# Patient Record
Sex: Male | Born: 1990 | Race: White | Hispanic: No | Marital: Single | Smoking: Current every day smoker
Health system: Southern US, Community
[De-identification: ages and names within clinical notes are randomized; demographics above are authoritative.]

## PROBLEM LIST (undated history)

## (undated) HISTORY — PX: OTHER SURGICAL HISTORY: SHX169

---
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
## 2015-02-04 ENCOUNTER — Encounter: Payer: Self-pay | Admitting: Emergency Medicine

## 2015-02-04 ENCOUNTER — Emergency Department: Payer: Self-pay

## 2015-02-04 ENCOUNTER — Emergency Department
Admission: EM | Admit: 2015-02-04 | Discharge: 2015-02-04 | Disposition: A | Discharge status: Home / Self Care | Payer: Self-pay | Attending: Emergency Medicine | Admitting: Emergency Medicine

## 2015-02-04 DIAGNOSIS — S62172A Displaced fracture of trapezium [larger multangular], left wrist, initial encounter for closed fracture: Secondary | ICD-10-CM | POA: Insufficient documentation

## 2015-02-04 DIAGNOSIS — Y9389 Activity, other specified: Secondary | ICD-10-CM | POA: Insufficient documentation

## 2015-02-04 DIAGNOSIS — Y92410 Unspecified street and highway as the place of occurrence of the external cause: Secondary | ICD-10-CM | POA: Insufficient documentation

## 2015-02-04 DIAGNOSIS — F17210 Nicotine dependence, cigarettes, uncomplicated: Secondary | ICD-10-CM | POA: Insufficient documentation

## 2015-02-04 DIAGNOSIS — Y998 Other external cause status: Secondary | ICD-10-CM | POA: Insufficient documentation

## 2015-02-04 MED ORDER — HYDROCODONE-ACETAMINOPHEN 5-325 MG PO TABS: 1.0000 | ORAL_TABLET | Freq: Four times a day (QID) | ORAL | Status: DC | PRN

## 2015-02-04 MED ORDER — TRAMADOL HCL 50 MG PO TABS: 50.0000 mg | ORAL_TABLET | Freq: Four times a day (QID) | ORAL | Status: DC | PRN
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
## 2015-02-04 NOTE — Discharge Instructions (Signed)
Avulsion Fracture of the Hand  An avulsion fracture of the hand means that a piece of bone in your hand or wrist has been torn away. Bones are connected to other bones by strong bands of connective tissue (ligaments). Muscles are also connected to bones with connective tissue (tendons). Avulsion fractures occur when severe stress on a bone from a ligament or tendon causes a small piece of bone to be pulled away.  CAUSES  Avulsion fractures of the hand may be caused by:  Falling with your hand outstretched.  Sports injuries.  Injuries where a thumb or finger is jammed against a hard surface. RISK FACTORS  You may have a higher risk of avulsion fracture of the hand if you:  Participate in activities during which falling is more likely, such as:  Gymnastics.  Skiing.  Ice skating. Participate in activities during which jamming your thumb or finger is more likely, such as basketball.  Have a job or participate in activities in which sudden force may occur on one or more fingers, such as:  Football.  Construction work.  Carpentry.  Factory work. Have had diabetes for many years.  Have osteoporosis. SIGNS AND SYMPTOMS  The most common symptom of an avulsion fracture of the hand is intense pain at the time of injury. You may also feel a pop or tearing. The pain continues after the injury. Other signs and symptoms may include:  Swelling.  Bruising.  Pain with movement.  Pain when pressure is applied to the injured area.  Warmth over the injured area. DIAGNOSIS  An avulsion fracture of the the hand may be diagnosed by:  History. Your health care provider will ask you what occurred during the time of your injury and whether you had any pain in the area before your injury.  Physical exam. Your health care provider may try to move your hand, fingers, and wrist to check for pain and your ability to move these parts of your hand.  X-ray. This will show if any bones are fractured or out of place.    MRI. This will show your tendons and ligaments. Some avulsion fractures are associated with an injury to a tendon or ligament. TREATMENT  Treatment for an avulsion fracture of the hand depends on the size of the bone that is out of place and how much it has been pulled out of place. Small avulsion fractures may be treated with rest and support. Large fragments of bone usually need to be reattached surgically. Treatment of these fractures may also require physical therapy to regain full function of your hand or wrist. Treatment may include:  Rest, ice, compression, and elevation (RICE treatment) as directed by your health care provider.  Medicines that reduce pain and swelling (NSAIDs).  Wearing a splint, elastic wrap, or cast as directed by your health care provider.  Surgery to reattach the bone and tendon or ligament.  Physical therapy. This may last for several months. HOME CARE INSTRUCTIONS  Take medicines only as directed by your health care provider.  Rest your hand until your health care provider says you can resume activity.  Keep your hand raised above the level of your heart when resting.  Apply ice to the injured area:  Place ice in a plastic bag.  Place a towel between your skin and the bag.  Leave the ice for 20 minutes, 2-3 times a day. Do not allow your cast or splint to get wet as directed by your health care provider.  Keep all follow-up visits as directed by your health care provider. This is important. SEEK MEDICAL CARE IF:  Your pain gets worse.  You have chills or fever.  Your cast or splint is damaged.  Your cast has a bad odor or stains from fluids from your wound. SEEK IMMEDIATE MEDICAL CARE IF:  Your hand is cold, blue, or pale.  You have pain, swelling, redness, or numbness below your cast or splint. MAKE SURE YOU:  Understand these instructions.  Will watch your condition.  Will get help right away if you are not doing well or get worse. This information is  not intended to replace advice given to you by your health care provider. Make sure you discuss any questions you have with your health care provider.  Document Released: 02/14/2004 Document Revised: 01/27/2014 Document Reviewed: 04/01/2013  Elsevier Interactive Patient Education [employer]
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
## 2015-03-16 ENCOUNTER — Encounter: Payer: Self-pay | Admitting: Emergency Medicine

## 2015-03-16 ENCOUNTER — Emergency Department: Payer: Self-pay

## 2015-03-16 ENCOUNTER — Emergency Department
Admission: EM | Admit: 2015-03-16 | Discharge: 2015-03-16 | Disposition: A | Discharge status: Home / Self Care | Payer: Self-pay | Attending: Emergency Medicine | Admitting: Emergency Medicine

## 2015-03-16 DIAGNOSIS — R112 Nausea with vomiting, unspecified: Secondary | ICD-10-CM | POA: Insufficient documentation

## 2015-03-16 DIAGNOSIS — F17210 Nicotine dependence, cigarettes, uncomplicated: Secondary | ICD-10-CM | POA: Insufficient documentation

## 2015-03-16 DIAGNOSIS — R197 Diarrhea, unspecified: Secondary | ICD-10-CM | POA: Insufficient documentation

## 2015-03-16 DIAGNOSIS — M549 Dorsalgia, unspecified: Secondary | ICD-10-CM

## 2015-03-16 DIAGNOSIS — M545 Low back pain: Secondary | ICD-10-CM | POA: Insufficient documentation

## 2015-03-16 LAB — COMPREHENSIVE METABOLIC PANEL
ALT: 17 U/L (ref 17–63)
AST: 24 U/L (ref 15–41)
Albumin: 4.8 g/dL (ref 3.5–5.0)
Alkaline Phosphatase: 80 U/L (ref 38–126)
Anion gap: 10 (ref 5–15)
BUN: 17 mg/dL (ref 6–20)
CHLORIDE: 99 mmol/L — AB (ref 101–111)
CO2: 25 mmol/L (ref 22–32)
CREATININE: 0.99 mg/dL (ref 0.61–1.24)
Calcium: 9.4 mg/dL (ref 8.9–10.3)
Glucose, Bld: 113 mg/dL — ABNORMAL HIGH (ref 65–99)
POTASSIUM: 3.7 mmol/L (ref 3.5–5.1)
Sodium: 134 mmol/L — ABNORMAL LOW (ref 135–145)
TOTAL PROTEIN: 7.9 g/dL (ref 6.5–8.1)
Total Bilirubin: 1.7 mg/dL — ABNORMAL HIGH (ref 0.3–1.2)

## 2015-03-16 LAB — URINALYSIS COMPLETE WITH MICROSCOPIC (ARMC ONLY)
BILIRUBIN URINE: NEGATIVE
Bacteria, UA: NONE SEEN
Glucose, UA: NEGATIVE mg/dL
HGB URINE DIPSTICK: NEGATIVE
LEUKOCYTES UA: NEGATIVE
NITRITE: NEGATIVE
PH: 8 (ref 5.0–8.0)
Protein, ur: 30 mg/dL — AB
RBC / HPF: NONE SEEN RBC/hpf (ref 0–5)
SPECIFIC GRAVITY, URINE: 1.028 (ref 1.005–1.030)
SQUAMOUS EPITHELIAL / LPF: NONE SEEN

## 2015-03-16 LAB — CBC
HCT: 47.8 % (ref 40.0–52.0)
Hemoglobin: 16.2 g/dL (ref 13.0–18.0)
MCH: 30.4 pg (ref 26.0–34.0)
MCHC: 33.8 g/dL (ref 32.0–36.0)
MCV: 89.8 fL (ref 80.0–100.0)
PLATELETS: 237 10*3/uL (ref 150–440)
RBC: 5.33 MIL/uL (ref 4.40–5.90)
RDW: 13.2 % (ref 11.5–14.5)
WBC: 15.1 10*3/uL — AB (ref 3.8–10.6)

## 2015-03-16 LAB — LIPASE, BLOOD: LIPASE: 22 U/L (ref 11–51)

## 2015-03-16 MED ORDER — KETOROLAC TROMETHAMINE 60 MG/2ML IM SOLN
60.0000 mg | Freq: Once | INTRAMUSCULAR | Status: AC
  Administered 2015-03-16: 60 mg via INTRAMUSCULAR

## 2015-03-16 MED ORDER — SUCRALFATE 1 G PO TABS: 1.0000 g | ORAL_TABLET | Freq: Four times a day (QID) | ORAL | Status: DC

## 2015-03-16 MED ORDER — KETOROLAC TROMETHAMINE 60 MG/2ML IM SOLN
INTRAMUSCULAR | Status: AC
  Administered 2015-03-16: 60 mg via INTRAMUSCULAR
  Filled 2015-03-16: qty 2

## 2015-03-16 MED ORDER — CYCLOBENZAPRINE HCL 5 MG PO TABS: 5.0000 mg | ORAL_TABLET | Freq: Three times a day (TID) | ORAL | Status: DC | PRN

## 2015-03-16 NOTE — ED Provider Notes (Signed)
[facility] Emergency Department Provider Note    ____________________________________________  Time seen: ~1415  I have reviewed the triage vital signs and the nursing notes.   HISTORY  Chief Complaint Emesis and Back Pain   History limited by: Not Limited   HPI Avory Mimbs. is a 25 y.o. male who presents to the emergency department today because of concerns for back pain. He states it is severe. It started this morning. He stated he had a couple episodes of emesis prior to the pain starting. He describes the pain as being located in the upper and lower back. He states it "hurts all over". He has not tried taking any medication for the pain. He states he has also had diarrhea. The vomiting and diarrhea were both nonbloody. He denies any associated abdominal pain. He states he had a nephew who also had a 24-hour bug that causes vomiting and diarrhea although he does not think that this is the same thing. He has not appreciated any fevers.   History reviewed. No pertinent past medical history.  There are no active problems to display for this patient.   Past Surgical History  Procedure Laterality Date  . Left hand surgery       Current Outpatient Rx  Name  Route  Sig  Dispense  Refill  . HYDROcodone-acetaminophen (NORCO) 5-325 MG tablet   Oral   Take 1 tablet by mouth every 6 (six) hours as needed for moderate pain.   10 tablet   0   . traMADol (ULTRAM) 50 MG tablet   Oral   Take 1 tablet (50 mg total) by mouth every 6 (six) hours as needed.   20 tablet   0     Allergies Tramadol  History reviewed. No pertinent family history.  Social History Social History  Substance Use Topics  . Smoking status: Current Every Day Smoker -- 0.50 packs/day    Types: Cigarettes  . Smokeless tobacco: Never Used  . Alcohol Use: No    Review of Systems  Constitutional: Negative for fever. Cardiovascular: Negative for chest pain. Respiratory:  Negative for shortness of breath. Gastrointestinal: Negative for abdominal pain. Positive for vomiting and diarrhea Genitourinary: Negative for dysuria. Musculoskeletal: Positive for back pain. Neurological: Negative for headaches, focal weakness or numbness.   10-point ROS otherwise negative.  ____________________________________________   PHYSICAL EXAM:  VITAL SIGNS: ED Triage Vitals  Enc Vitals Group     BP 03/16/15 1304 117/78 mmHg     Pulse Rate 03/16/15 1304 103     Resp 03/16/15 1304 24     Temp 03/16/15 1304 97.8 F (36.6 C)     Temp Source 03/16/15 1304 Oral     SpO2 03/16/15 1304 96 %     Weight 03/16/15 1304 140 lb (63.504 kg)     Height 03/16/15 1304  (1.778 m)     Head Cir --      Peak Flow --      Pain Score 03/16/15 1304 10   Constitutional: Alert and oriented. Appears in mild distress. Eyes: Conjunctivae are normal. PERRL. Normal extraocular movements. ENT   Head: Normocephalic and atraumatic.   Nose: No congestion/rhinnorhea.   Mouth/Throat: Mucous membranes are moist.   Neck: No stridor. Hematological/Lymphatic/Immunilogical: No cervical lymphadenopathy. Cardiovascular: Normal rate, regular rhythm.  No murmurs, rubs, or gallops. Respiratory: Normal respiratory effort without tachypnea nor retractions. Breath sounds are clear and equal bilaterally. No wheezes/rales/rhonchi. Gastrointestinal: Soft and nontender. No distention.  Genitourinary: Deferred Musculoskeletal: Normal range of motion in all extremities. No joint effusions.  No lower extremity tenderness nor edema. No midline back pain. Neurologic:  Normal speech and language. No gross focal neurologic deficits are appreciated.  Skin:  Skin is warm, dry and intact. No rash noted. Psychiatric: Mood and affect are normal. Speech and behavior are normal. Patient exhibits appropriate insight and judgment.  ____________________________________________    LABS (pertinent  positives/negatives)  Labs Reviewed  COMPREHENSIVE METABOLIC PANEL - Abnormal; Notable for the following:    Sodium 134 (*)    Chloride 99 (*)    Glucose, Bld 113 (*)    Total Bilirubin 1.7 (*)    All other components within normal limits  CBC - Abnormal; Notable for the following:    WBC 15.1 (*)    All other components within normal limits  URINALYSIS COMPLETEWITH MICROSCOPIC ([facility] ONLY) - Abnormal; Notable for the following:    Color, Urine YELLOW (*)    APPearance CLEAR (*)    Ketones, ur 1+ (*)    Protein, ur 30 (*)    All other components within normal limits  LIPASE, BLOOD     ____________________________________________   EKG  None  ____________________________________________    RADIOLOGY  CXR  IMPRESSION: Normal exam.   ____________________________________________   PROCEDURES  Procedure(s) performed: None  Critical Care performed: No  ____________________________________________   INITIAL IMPRESSION / ASSESSMENT AND PLAN / ED COURSE  Pertinent labs & imaging results that were available during my care of the patient were reviewed by me and considered in my medical decision making (see chart for details).  Patient presented to the emergency department today because of concerns for vomiting, back pain and diarrhea. On exam patient digit. Mild distress. No abdominal tenderness. No tenderness to palpation of the back. Given history of vomiting and then back pain however did obtain a chest x-ray to make sure there is no esophageal injury. X-ray was without any concerning findings. Patient stated he did feel better after Toradol. At this point I think patient likely with a viral gastroenteritis given history of vomiting diarrhea and sick contact. Think the back pain could be related to muscle strain or spasm. I doubt esophageal injury. Doubt aortic injury. Doubt pneumothorax. Patient did not have any midline spinal tenderness doubt any epidural abscess or  vertebral osseous injury. Will plan on discharging home with sucralfate and muscle relaxer.  ____________________________________________   FINAL CLINICAL IMPRESSION(S) / ED DIAGNOSES  Final diagnoses:  Nausea and vomiting, vomiting of unspecified type  Back pain, unspecified location     Phineas Semen, MD 03/16/15 1622

## 2015-03-16 NOTE — Discharge Instructions (Signed)
Please seek medical attention for any high fevers, chest pain, shortness of breath, change in behavior, persistent vomiting, bloody stool or any other new or concerning symptoms. ° ° °Back Pain, Adult °Back pain is very common in adults. The cause of back pain is rarely dangerous and the pain often gets better over time. The cause of your back pain may not be known. Some common causes of back pain include: °· Strain of the muscles or ligaments supporting the spine. °· Wear and tear (degeneration) of the spinal disks. °· Arthritis. °· Direct injury to the back. °For many people, back pain may return. Since back pain is rarely dangerous, most people can learn to manage this condition on their own. °HOME CARE INSTRUCTIONS °Watch your back pain for any changes. The following actions may help to lessen any discomfort you are feeling: °· Remain active. It is stressful on your back to sit or stand in one place for long periods of time. Do not sit, drive, or stand in one place for more than 30 minutes at a time. Take short walks on even surfaces as soon as you are able. Try to increase the length of time you walk each day. °· Exercise regularly as directed by your health care provider. Exercise helps your back heal faster. It also helps avoid future injury by keeping your muscles strong and flexible. °· Do not stay in bed. Resting more than 1-2 days can delay your recovery. °· Pay attention to your body when you bend and lift. The most comfortable positions are those that put less stress on your recovering back. Always use proper lifting techniques, including: °¨ Bending your knees. °¨ Keeping the load close to your body. °¨ Avoiding twisting. °· Find a comfortable position to sleep. Use a firm mattress and lie on your side with your knees slightly bent. If you lie on your back, put a pillow under your knees. °· Avoid feeling anxious or stressed. Stress increases muscle tension and can worsen back pain. It is important to  recognize when you are anxious or stressed and learn ways to manage it, such as with exercise. °· Take medicines only as directed by your health care provider. Over-the-counter medicines to reduce pain and inflammation are often the most helpful. Your health care provider may prescribe muscle relaxant drugs. These medicines help dull your pain so you can more quickly return to your normal activities and healthy exercise. °· Apply ice to the injured area: °¨ Put ice in a plastic bag. °¨ Place a towel between your skin and the bag. °¨ Leave the ice on for 20 minutes, 2-3 times a day for the first 2-3 days. After that, ice and heat may be alternated to reduce pain and spasms. °· Maintain a healthy weight. Excess weight puts extra stress on your back and makes it difficult to maintain good posture. °SEEK MEDICAL CARE IF: °· You have pain that is not relieved with rest or medicine. °· You have increasing pain going down into the legs or buttocks. °· You have pain that does not improve in one week. °· You have night pain. °· You lose weight. °· You have a fever or chills. °SEEK IMMEDIATE MEDICAL CARE IF:  °· You develop new bowel or bladder control problems. °· You have unusual weakness or numbness in your arms or legs. °· You develop nausea or vomiting. °· You develop abdominal pain. °· You feel faint. °  °This information is not intended to replace advice given to you by your health care provider. Make   sure you discuss any questions you have with your health care provider. °  °Document Released: 01/06/2005 Document Revised: 01/27/2014 Document Reviewed: 05/10/2013 °Elsevier Interactive Patient Education ©2016 [employer] ° °

## 2015-03-16 NOTE — ED Notes (Signed)
Pt complains of vomiting that started this morning. Pt is complaining of pain to back.
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
## 2017-02-27 ENCOUNTER — Emergency Department
Admission: EM | Admit: 2017-02-27 | Discharge: 2017-02-27 | Disposition: A | Discharge status: Home / Self Care | Payer: Self-pay | Attending: Emergency Medicine | Admitting: Emergency Medicine

## 2017-02-27 DIAGNOSIS — Z79899 Other long term (current) drug therapy: Secondary | ICD-10-CM | POA: Insufficient documentation

## 2017-02-27 DIAGNOSIS — F17210 Nicotine dependence, cigarettes, uncomplicated: Secondary | ICD-10-CM | POA: Insufficient documentation

## 2017-02-27 DIAGNOSIS — R112 Nausea with vomiting, unspecified: Secondary | ICD-10-CM

## 2017-02-27 DIAGNOSIS — R509 Fever, unspecified: Secondary | ICD-10-CM

## 2017-02-27 LAB — URINE DRUG SCREEN, QUALITATIVE (ARMC ONLY)
Amphetamines, Ur Screen: NOT DETECTED
BARBITURATES, UR SCREEN: NOT DETECTED
Benzodiazepine, Ur Scrn: NOT DETECTED
CANNABINOID 50 NG, UR ~~LOC~~: POSITIVE — AB
COCAINE METABOLITE, UR ~~LOC~~: NOT DETECTED
MDMA (ECSTASY) UR SCREEN: NOT DETECTED
Methadone Scn, Ur: NOT DETECTED
OPIATE, UR SCREEN: POSITIVE — AB
PHENCYCLIDINE (PCP) UR S: NOT DETECTED
Tricyclic, Ur Screen: NOT DETECTED

## 2017-02-27 LAB — RAPID HIV SCREEN (HIV 1/2 AB+AG)
HIV 1/2 Antibodies: NONREACTIVE
HIV-1 P24 Antigen - HIV24: NONREACTIVE

## 2017-02-27 LAB — ETHANOL: Alcohol, Ethyl (B): <10 mg/dL (ref <10)

## 2017-02-27 LAB — COMPREHENSIVE METABOLIC PANEL
ALBUMIN: 3.8 g/dL (ref 3.5–5.0)
ALT: 45 U/L (ref 17–63)
ANION GAP: 12 (ref 5–15)
AST: 42 U/L — ABNORMAL HIGH (ref 15–41)
Alkaline Phosphatase: 61 U/L (ref 38–126)
BILIRUBIN TOTAL: 1.9 mg/dL — AB (ref 0.3–1.2)
BUN: 7 mg/dL (ref 6–20)
CALCIUM: 9.5 mg/dL (ref 8.9–10.3)
CO2: 22 mmol/L (ref 22–32)
Chloride: 99 mmol/L — ABNORMAL LOW (ref 101–111)
Creatinine, Ser: 0.85 mg/dL (ref 0.61–1.24)
GFR calc Af Amer: >60 mL/min (ref >60)
Glucose, Bld: 96 mg/dL (ref 65–99)
POTASSIUM: 3.5 mmol/L (ref 3.5–5.1)
Sodium: 133 mmol/L — ABNORMAL LOW (ref 135–145)
Total Protein: 7.4 g/dL (ref 6.5–8.1)

## 2017-02-27 LAB — CBC WITH DIFFERENTIAL/PLATELET
BASOS PCT: 0 %
Basophils Absolute: 0 10*3/uL (ref 0–0.1)
Eosinophils Absolute: 0.1 10*3/uL (ref 0–0.7)
Eosinophils Relative: 1 %
HEMATOCRIT: 42.4 % (ref 40.0–52.0)
Hemoglobin: 14.3 g/dL (ref 13.0–18.0)
Lymphocytes Relative: 9 %
Lymphs Abs: 0.4 10*3/uL — ABNORMAL LOW (ref 1.0–3.6)
MCH: 29 pg (ref 26.0–34.0)
MCHC: 33.8 g/dL (ref 32.0–36.0)
MCV: 85.6 fL (ref 80.0–100.0)
MONO ABS: 0 10*3/uL — AB (ref 0.2–1.0)
MONOS PCT: 1 %
NEUTROS ABS: 4.4 10*3/uL (ref 1.4–6.5)
Neutrophils Relative %: 89 %
Platelets: 245 10*3/uL (ref 150–440)
RBC: 4.95 MIL/uL (ref 4.40–5.90)
RDW: 14.5 % (ref 11.5–14.5)
WBC: 4.9 10*3/uL (ref 3.8–10.6)

## 2017-02-27 LAB — INFLUENZA PANEL BY PCR (TYPE A & B)
INFLAPCR: NEGATIVE
INFLBPCR: NEGATIVE

## 2017-02-27 MED ORDER — SODIUM CHLORIDE 0.9 % IV BOLUS (SEPSIS)
1000.0000 mL | Freq: Once | INTRAVENOUS | Status: AC
  Administered 2017-02-27: 1000 mL via INTRAVENOUS

## 2017-02-27 MED ORDER — ONDANSETRON HCL 4 MG/2ML IJ SOLN
4.0000 mg | Freq: Once | INTRAMUSCULAR | Status: AC
  Administered 2017-02-27: 4 mg via INTRAVENOUS
  Filled 2017-02-27: qty 2

## 2017-02-27 MED ORDER — KETOROLAC TROMETHAMINE 30 MG/ML IJ SOLN
30.0000 mg | Freq: Once | INTRAMUSCULAR | Status: AC
  Administered 2017-02-27: 30 mg via INTRAVENOUS
  Filled 2017-02-27: qty 1

## 2017-02-27 MED ORDER — CEPHALEXIN 500 MG PO CAPS: 500.0000 mg | ORAL_CAPSULE | Freq: Three times a day (TID) | ORAL | 0 refills | Status: DC

## 2017-02-27 MED ORDER — ACETAMINOPHEN 500 MG PO TABS
1000.0000 mg | ORAL_TABLET | Freq: Once | ORAL | Status: AC
  Administered 2017-02-27: 1000 mg via ORAL
  Filled 2017-02-27: qty 2

## 2017-02-27 MED ORDER — ONDANSETRON 4 MG PO TBDP: 4.0000 mg | ORAL_TABLET | Freq: Three times a day (TID) | ORAL | 0 refills | Status: DC | PRN

## 2017-02-27 NOTE — ED Provider Notes (Signed)
[facility], [facility] Emergency Department Provider Note  Time seen: 5:07 PM  I have reviewed the triage vital signs and the nursing notes.   HISTORY  Chief Complaint Drug Problem    HPI Jeffery FeltRussell L Griffith Jr. is a 27 y.o. male with no significant past medical history presents to the emergency department for body aches, fever nausea vomiting.  According to the patient he has a history of drug use including IV heroin use, last use approximately 1 week ago.  He states over the past 1 week he has been feeling body aches cramps, headache nauseated at times with subjective fevers, occasional episodes of vomiting.  Patient denies any abdominal pain.  No chest pain.  History reviewed. No pertinent past medical history.  There are no active problems to display for this patient.   Past Surgical History:  Procedure Laterality Date  . left hand surgery       Prior to Admission medications   Medication Sig Start Date End Date Taking? Authorizing Provider  cyclobenzaprine (FLEXERIL) 5 MG tablet Take 1 tablet (5 mg total) by mouth every 8 (eight) hours as needed for muscle spasms. 03/16/15   Phineas SemenGoodman, Graydon, MD  HYDROcodone-acetaminophen (NORCO) 5-325 MG tablet Take 1 tablet by mouth every 6 (six) hours as needed for moderate pain. 02/04/15   Evon SlackGaines, Thomas C, PA-C  sucralfate (CARAFATE) 1 g tablet Take 1 tablet (1 g total) by mouth 4 (four) times daily. 03/16/15   Phineas SemenGoodman, Graydon, MD  traMADol (ULTRAM) 50 MG tablet Take 1 tablet (50 mg total) by mouth every 6 (six) hours as needed. 02/04/15   Evon SlackGaines, Thomas C, PA-C    Allergies  Allergen Reactions  . Tramadol Itching    No family history on file.  Social History Social History   Tobacco Use  . Smoking status: Current Every Day Smoker    Packs/day: 0.50    Types: Cigarettes  . Smokeless tobacco: Never Used  Substance Use Topics  . Alcohol use: No  . Drug use: Yes    Types: Marijuana    Comment: last use was last week     Review of Systems Constitutional: Subjective fever Eyes: Negative for visual complaints ENT: Negative for recent illness/congestion.  Negative for sore throat. Cardiovascular: Negative for chest pain. Respiratory: Negative for shortness of breath.  Negative for cough Gastrointestinal: Negative for abdominal pain.  Positive for nausea and vomiting. Genitourinary: Negative for urinary compaints Musculoskeletal: Positive for diffuse muscle cramps including bilateral leg cramping. Skin: Negative for skin complaints  Neurological: Negative for headache All other ROS negative  ____________________________________________   PHYSICAL EXAM:  VITAL SIGNS: ED Triage Vitals [02/27/17 1321]  Enc Vitals Group     BP (!) 124/95     Pulse Rate (!) 135     Resp 18     Temp 99.4 F (37.4 C)     Temp Source Oral     SpO2 100 %     Weight 140 lb (63.5 kg)     Height 5\' 10"  (1.778 m)     Head Circumference      Peak Flow      Pain Score      Pain Loc      Pain Edu?      Excl. in GC?    Constitutional: Alert and oriented. Well appearing and in no distress. Eyes: Normal exam ENT   Head: Normocephalic and atraumatic.   Mouth/Throat: Mucous membranes are moist. Cardiovascular: Regular rhythm, rate around 120  bpm.  No murmur. Respiratory: Normal respiratory effort without tachypnea nor retractions. Breath sounds are clear  Gastrointestinal: Soft and nontender. No distention.   Musculoskeletal: Nontender with normal range of motion in all extremities. No lower extremity tenderness or edema.  Nontender to palpation. Neurologic:  Normal speech and language. No gross focal neurologic deficits Skin:  Skin is warm, dry and intact.  Psychiatric: Mood and affect are normal.   ____________________________________________    EKG  EKG reviewed and interpreted by myself shows sinus tachycardia 128 bpm with a narrow QRS, normal axis, normal intervals, nonspecific ST changes.  No ST  elevation.   INITIAL IMPRESSION / ASSESSMENT AND PLAN / ED COURSE  Pertinent labs & imaging results that were available during my care of the patient were reviewed by me and considered in my medical decision making (see chart for details).  Patient presents to the emergency department with vague symptoms of headache, nausea, vomiting diffuse body aches cramping.  Patient's differential is quite broad but could include viral illness, influenza, bacteremia, HIV.  We will check labs, obtain verbal consent for rapid HIV testing, check blood cultures, IV hydrate, treat Toradol and Tylenol, Zofran and continue to closely monitor.  Patient agreeable to this plan of care.  Patient feeling better after medications.  Labs are normal including negative HIV.  Negative influenza.  Given the patient's low-grade fever and tachycardia we will cover with antibiotics until the patient's blood cultures have grown out.  We will place the patient on Keflex 3 times daily times 10 days.  Patient appears much improved after fluids.  Suspect viral illness, but with recent IV drug use cannot rule out bacteremia, although extremely unlikely.  ____________________________________________   FINAL CLINICAL IMPRESSION(S) / ED DIAGNOSES  Fever Nausea vomiting    [name], MD 02/27/17 [phone]

## 2017-02-27 NOTE — ED Triage Notes (Signed)
Pt came to ED via pov. Reports used heroine 1 week ago and ever since he has not been feeling well. Pt cannot sit still in triage. C/o back pain, leg pain, nausea. C/o sob as well. Concerned the drugs were poisoned.

## 2017-02-27 NOTE — ED Notes (Signed)
See triage note  States he just doesn't feel well  Low grade temp and rapid heart rate on arrival

## 2017-03-04 LAB — CULTURE, BLOOD (ROUTINE X 2)
CULTURE: NO GROWTH
Culture: NO GROWTH
SPECIAL REQUESTS: ADEQUATE
Special Requests: ADEQUATE

## 2017-11-24 ENCOUNTER — Encounter: Payer: Self-pay | Admitting: Emergency Medicine

## 2017-11-24 ENCOUNTER — Emergency Department
Admission: EM | Admit: 2017-11-24 | Discharge: 2017-11-24 | Disposition: A | Discharge status: Home / Self Care | Payer: Self-pay | Attending: Student in an Organized Health Care Education/Training Program | Admitting: Student in an Organized Health Care Education/Training Program

## 2017-11-24 ENCOUNTER — Other Ambulatory Visit: Payer: Self-pay

## 2017-11-24 DIAGNOSIS — Y929 Unspecified place or not applicable: Secondary | ICD-10-CM | POA: Insufficient documentation

## 2017-11-24 DIAGNOSIS — S01112A Laceration without foreign body of left eyelid and periocular area, initial encounter: Secondary | ICD-10-CM | POA: Insufficient documentation

## 2017-11-24 DIAGNOSIS — W01198A Fall on same level from slipping, tripping and stumbling with subsequent striking against other object, initial encounter: Secondary | ICD-10-CM | POA: Insufficient documentation

## 2017-11-24 DIAGNOSIS — Y999 Unspecified external cause status: Secondary | ICD-10-CM | POA: Insufficient documentation

## 2017-11-24 DIAGNOSIS — Y9301 Activity, walking, marching and hiking: Secondary | ICD-10-CM | POA: Insufficient documentation

## 2017-11-24 MED ORDER — LIDOCAINE HCL (PF) 1 % IJ SOLN
10.0000 mL | Freq: Once | INTRAMUSCULAR | Status: AC
  Administered 2017-11-24: 10 mL
  Filled 2017-11-24: qty 10

## 2017-11-24 MED ORDER — CEPHALEXIN 500 MG PO CAPS: 1000.0000 mg | ORAL_CAPSULE | Freq: Two times a day (BID) | ORAL | 0 refills | Status: DC

## 2017-11-24 NOTE — ED Provider Notes (Signed)
[facility] Emergency Department Provider Note  ____________________________________________  Time seen: Approximately 7:39 PM  I have reviewed the triage vital signs and the nursing notes.   HISTORY  Chief Complaint Laceration    HPI Jeffery Knight. is a 27 y.o. male who presents the emergency department complaining of a laceration through the left eyebrow.  Patient reports that he was at home, getting out of the shower when the mat stop from underneath his feet.  This caused him to fall.  He struck the left eyebrow region on the corner of the sink.  Patient reports that it caused a laceration.  He has no facial pain states that he is here for laceration repair only.  Tetanus shot was within the last 2 years.  Patient denies any headache, facial pain, blurred vision, visual changes.  Bleeding was controlled with direct pressure.  No other complaints at this time.    History reviewed. No pertinent past medical history.  There are no active problems to display for this patient.   Past Surgical History:  Procedure Laterality Date  . left hand surgery       Prior to Admission medications   Medication Sig Start Date End Date Taking? Authorizing Provider  cephALEXin (KEFLEX) 500 MG capsule Take 2 capsules (1,000 mg total) by mouth 2 (two) times daily. 11/24/17   , Delorise Royals, PA-C  cyclobenzaprine (FLEXERIL) 5 MG tablet Take 1 tablet (5 mg total) by mouth every 8 (eight) hours as needed for muscle spasms. 03/16/15   Phineas Semen, MD  HYDROcodone-acetaminophen (NORCO) 5-325 MG tablet Take 1 tablet by mouth every 6 (six) hours as needed for moderate pain. 02/04/15   Evon Slack, PA-C  ondansetron (ZOFRAN ODT) 4 MG disintegrating tablet Take 1 tablet (4 mg total) by mouth every 8 (eight) hours as needed for nausea or vomiting. 02/27/17   [name], MD  sucralfate (CARAFATE) 1 g tablet Take 1 tablet (1 g total) by mouth 4 (four) times daily.  03/16/15   Phineas Semen, MD  traMADol (ULTRAM) 50 MG tablet Take 1 tablet (50 mg total) by mouth every 6 (six) hours as needed. 02/04/15   Evon Slack, PA-C    Allergies Tramadol  No family history on file.  Social History Social History   Tobacco Use  . Smoking status: Current Every Day Smoker    Packs/day: 0.50    Types: Cigarettes  . Smokeless tobacco: Never Used  Substance Use Topics  . Alcohol use: No  . Drug use: Yes    Types: Marijuana    Comment: last use was last week     Review of Systems  Constitutional: No fever/chills Eyes: No visual changes. No discharge ENT: No upper respiratory complaints. Cardiovascular: no chest pain. Respiratory: no cough. No SOB. Gastrointestinal: No abdominal pain.  No nausea, no vomiting.  Musculoskeletal: Negative for musculoskeletal pain. Skin: Positive for left eyebrow laceration Neurological: Negative for headaches, focal weakness or numbness. 10-point ROS otherwise negative.  ____________________________________________   PHYSICAL EXAM:  VITAL SIGNS: ED Triage Vitals [11/24/17 1905]  Enc Vitals Group     BP 115/77     Pulse Rate (!) 109     Resp 18     Temp 98.3 F (36.8 C)     Temp Source Oral     SpO2 97 %     Weight 160 lb (72.6 kg)     Height 5\' 10"  (1.778 m)     Head Circumference  Peak Flow      Pain Score 6     Pain Loc      Pain Edu?      Excl. in GC?      Constitutional: Alert and oriented. Well appearing and in no acute distress. Eyes: Conjunctivae are normal. PERRL. EOMI. Head: Patient has a 3 cm laceration extending vertically through the left eyebrow.  Edges are gaped open.  No bleeding.  No foreign body.  Patient has no tenderness to palpation along the orbit.  No palpable crepitus.  No surrounding ecchymosis. ENT:      Ears:       Nose: No congestion/rhinnorhea.      Mouth/Throat: Mucous membranes are moist.  Neck: No stridor.  No cervical spine tenderness to palpation.   Cardiovascular: Normal rate, regular rhythm. Normal S1 and S2.  Good peripheral circulation. Respiratory: Normal respiratory effort without tachypnea or retractions. Lungs CTAB. Good air entry to the bases with no decreased or absent breath sounds. Musculoskeletal: Full range of motion to all extremities. No gross deformities appreciated. Neurologic:  Normal speech and language. No gross focal neurologic deficits are appreciated.  Skin:  Skin is warm, dry and intact. No rash noted. Psychiatric: Mood and affect are normal. Speech and behavior are normal. Patient exhibits appropriate insight and judgement.   ____________________________________________   LABS (all labs ordered are listed, but only abnormal results are displayed)  Labs Reviewed - No data to display ____________________________________________  EKG   ____________________________________________  RADIOLOGY   No results found.  ____________________________________________    PROCEDURES  Procedure(s) performed:    Marland KitchenMarland KitchenLaceration Repair Date/Time: 11/24/2017 8:36 PM Performed by: Racheal Patches, PA-C Authorized by: Racheal Patches, PA-C   Consent:    Consent obtained:  Verbal   Consent given by:  Patient   Risks discussed:  Pain Anesthesia (see MAR for exact dosages):    Anesthesia method:  Local infiltration   Local anesthetic:  Lidocaine 1% w/o epi Laceration details:    Location:  Face   Face location:  L eyebrow   Length (cm):  3 Repair type:    Repair type:  Simple Pre-procedure details:    Preparation:  Patient was prepped and draped in usual sterile fashion Exploration:    Hemostasis achieved with:  Direct pressure   Wound exploration: wound explored through full range of motion and entire depth of wound probed and visualized     Wound extent: no fascia violation noted, no foreign bodies/material noted, no muscle damage noted, no nerve damage noted, no tendon damage noted, no  underlying fracture noted and no vascular damage noted     Contaminated: no   Treatment:    Area cleansed with:  Betadine   Amount of cleaning:  Standard   Irrigation solution:  Sterile saline   Irrigation volume:  500 ml   Irrigation method:  Syringe Skin repair:    Repair method:  Sutures   Suture size:  5-0   Suture material:  Prolene   Suture technique:  Simple interrupted   Number of sutures:  7 Approximation:    Approximation:  Close Post-procedure details:    Dressing:  Open (no dressing)   Patient tolerance of procedure:  Tolerated well, no immediate complications      Medications  lidocaine (PF) (XYLOCAINE) 1 % injection 10 mL (has no administration in time range)     ____________________________________________   INITIAL IMPRESSION / ASSESSMENT AND PLAN / ED COURSE  Pertinent labs & imaging  results that were available during my care of the patient were reviewed by me and considered in my medical decision making (see chart for details).  Review of the [place] CSRS was performed in accordance of the [facility] prior to dispensing any controlled drugs.      Patient's diagnosis is consistent with left eyebrow laceration.  Patient presents the emergency department after falling, sustaining a laceration to the left eyebrow.  Patient denies any facial pain.  Exam was reassuring.  Laceration was repaired as described above.  Wound care instructions are discussed with patient.  As this occurred in the bathroom, patient will be prescribed antibiotics prophylactically.  Follow-up with primary care in 1 week for suture removal.. Patient will be discharged home with prescriptions for Keflex. Patient is to follow up with primary care in 1 week for suture removal or sooner as needed or otherwise directed. Patient is given ED precautions to return to the ED for any worsening or new symptoms.     ____________________________________________  FINAL CLINICAL IMPRESSION(S) / ED  DIAGNOSES  Final diagnoses:  Laceration of left eyebrow, initial encounter      NEW MEDICATIONS STARTED DURING THIS VISIT:  ED Discharge Orders         Ordered    cephALEXin (KEFLEX) 500 MG capsule  2 times daily     11/24/17 2039              This chart was dictated using voice recognition software/Dragon. Despite best efforts to proofread, errors can occur which can change the meaning. Any change was purely unintentional.    Racheal Patches, PA-C 11/24/17 2040    Willy Eddy, MD 11/24/17 [phone]

## 2017-11-24 NOTE — ED Triage Notes (Signed)
Pt reports that he fell in the bathroom and hit his left upper eye on the counter. Bleeding under control at this time. Pt has steri strips on them at this time. Unsure about his tetanus

## 2018-11-02 ENCOUNTER — Other Ambulatory Visit: Payer: Self-pay

## 2018-11-02 ENCOUNTER — Emergency Department: Payer: Self-pay

## 2018-11-02 ENCOUNTER — Encounter: Payer: Self-pay | Admitting: Emergency Medicine

## 2018-11-02 ENCOUNTER — Emergency Department
Admission: EM | Admit: 2018-11-02 | Discharge: 2018-11-02 | Disposition: A | Discharge status: Home / Self Care | Payer: Self-pay | Attending: Student | Admitting: Student

## 2018-11-02 DIAGNOSIS — F1910 Other psychoactive substance abuse, uncomplicated: Secondary | ICD-10-CM | POA: Insufficient documentation

## 2018-11-02 DIAGNOSIS — L03313 Cellulitis of chest wall: Secondary | ICD-10-CM | POA: Insufficient documentation

## 2018-11-02 DIAGNOSIS — M25511 Pain in right shoulder: Secondary | ICD-10-CM

## 2018-11-02 DIAGNOSIS — F17210 Nicotine dependence, cigarettes, uncomplicated: Secondary | ICD-10-CM | POA: Insufficient documentation

## 2018-11-02 DIAGNOSIS — R0602 Shortness of breath: Secondary | ICD-10-CM | POA: Insufficient documentation

## 2018-11-02 DIAGNOSIS — Z20828 Contact with and (suspected) exposure to other viral communicable diseases: Secondary | ICD-10-CM | POA: Insufficient documentation

## 2018-11-02 LAB — CBC WITH DIFFERENTIAL/PLATELET
Abs Immature Granulocytes: 0.07 10*3/uL (ref 0.00–0.07)
Basophils Absolute: 0.1 10*3/uL (ref 0.0–0.1)
Basophils Relative: 1 %
Eosinophils Absolute: 0.2 10*3/uL (ref 0.0–0.5)
Eosinophils Relative: 1 %
HCT: 41.6 % (ref 39.0–52.0)
Hemoglobin: 13.9 g/dL (ref 13.0–17.0)
Immature Granulocytes: 1 %
Lymphocytes Relative: 17 %
Lymphs Abs: 2.2 10*3/uL (ref 0.7–4.0)
MCH: 28 pg (ref 26.0–34.0)
MCHC: 33.4 g/dL (ref 30.0–36.0)
MCV: 83.7 fL (ref 80.0–100.0)
Monocytes Absolute: 1.1 10*3/uL — ABNORMAL HIGH (ref 0.1–1.0)
Monocytes Relative: 9 %
Neutro Abs: 9.3 10*3/uL — ABNORMAL HIGH (ref 1.7–7.7)
Neutrophils Relative %: 71 %
Platelets: 344 10*3/uL (ref 150–400)
RBC: 4.97 MIL/uL (ref 4.22–5.81)
RDW: 12.8 % (ref 11.5–15.5)
WBC: 12.9 10*3/uL — ABNORMAL HIGH (ref 4.0–10.5)
nRBC: 0 % (ref 0.0–0.2)

## 2018-11-02 LAB — URINE DRUG SCREEN, QUALITATIVE (ARMC ONLY)
Amphetamines, Ur Screen: NOT DETECTED
Barbiturates, Ur Screen: NOT DETECTED
Benzodiazepine, Ur Scrn: NOT DETECTED
Cannabinoid 50 Ng, Ur ~~LOC~~: POSITIVE — AB
Cocaine Metabolite,Ur ~~LOC~~: NOT DETECTED
MDMA (Ecstasy)Ur Screen: NOT DETECTED
Methadone Scn, Ur: NOT DETECTED
Opiate, Ur Screen: POSITIVE — AB
Phencyclidine (PCP) Ur S: NOT DETECTED
Tricyclic, Ur Screen: NOT DETECTED

## 2018-11-02 LAB — COMPREHENSIVE METABOLIC PANEL
ALT: 20 U/L (ref 0–44)
AST: 20 U/L (ref 15–41)
Albumin: 3.8 g/dL (ref 3.5–5.0)
Alkaline Phosphatase: 71 U/L (ref 38–126)
Anion gap: 11 (ref 5–15)
BUN: 6 mg/dL (ref 6–20)
CO2: 29 mmol/L (ref 22–32)
Calcium: 9.5 mg/dL (ref 8.9–10.3)
Chloride: 95 mmol/L — ABNORMAL LOW (ref 98–111)
Creatinine, Ser: 0.63 mg/dL (ref 0.61–1.24)
GFR calc Af Amer: >60 mL/min (ref >60)
GFR calc non Af Amer: >60 mL/min (ref >60)
Glucose, Bld: 96 mg/dL (ref 70–99)
Potassium: 3.7 mmol/L (ref 3.5–5.1)
Sodium: 135 mmol/L (ref 135–145)
Total Bilirubin: 1 mg/dL (ref 0.3–1.2)
Total Protein: 8.2 g/dL — ABNORMAL HIGH (ref 6.5–8.1)

## 2018-11-02 LAB — URINALYSIS, COMPLETE (UACMP) WITH MICROSCOPIC
Bacteria, UA: NONE SEEN
Bilirubin Urine: NEGATIVE
Glucose, UA: NEGATIVE mg/dL
Hgb urine dipstick: NEGATIVE
Ketones, ur: 5 mg/dL — AB
Leukocytes,Ua: NEGATIVE
Nitrite: NEGATIVE
Protein, ur: NEGATIVE mg/dL
Specific Gravity, Urine: 1.009 (ref 1.005–1.030)
pH: 7 (ref 5.0–8.0)

## 2018-11-02 LAB — LACTIC ACID, PLASMA
Lactic Acid, Venous: 1.6 mmol/L (ref 0.5–1.9)
Lactic Acid, Venous: 1.4 mmol/L (ref 0.5–1.9)

## 2018-11-02 LAB — TROPONIN I (HIGH SENSITIVITY): Troponin I (High Sensitivity): 3 ng/L (ref <18)

## 2018-11-02 LAB — SARS CORONAVIRUS 2 BY RT PCR (HOSPITAL ORDER, PERFORMED IN ~~LOC~~ HOSPITAL LAB): SARS Coronavirus 2: NEGATIVE

## 2018-11-02 LAB — FIBRIN DERIVATIVES D-DIMER (ARMC ONLY): Fibrin derivatives D-dimer (ARMC): 552.63 ng/mL (FEU) — ABNORMAL HIGH (ref 0.00–499.00)

## 2018-11-02 MED ORDER — SULFAMETHOXAZOLE-TRIMETHOPRIM 800-160 MG PO TABS: 1.0000 | ORAL_TABLET | Freq: Two times a day (BID) | ORAL | 0 refills | Status: DC

## 2018-11-02 MED ORDER — IOHEXOL 350 MG/ML SOLN
75.0000 mL | Freq: Once | INTRAVENOUS | Status: AC | PRN
  Administered 2018-11-02: 75 mL via INTRAVENOUS
  Filled 2018-11-02: qty 75

## 2018-11-02 MED ORDER — FENTANYL CITRATE (PF) 100 MCG/2ML IJ SOLN
50.0000 ug | Freq: Once | INTRAMUSCULAR | Status: AC
  Administered 2018-11-02: 50 ug via INTRAVENOUS
  Filled 2018-11-02: qty 2

## 2018-11-02 NOTE — Discharge Instructions (Signed)
Follow[facility]up with [facility] if any continued problems.  Return to the emergency department if any severe worsening of your symptoms.  Begin taking the Bactrim DS twice daily for 10 days.  You may take Tylenol or ibuprofen as needed for pain.  Use warm moist heat to the area on your chest often to help with the infection.  Be careful not to get the compress so hot that you get a burn to your skin.

## 2018-11-02 NOTE — ED Provider Notes (Addendum)
[facility] Emergency Department Provider Note  ____________________________________________   First MD Initiated Contact with Patient 11/02/18 1306     (approximate)  I have reviewed the triage vital signs and the nursing notes.   HISTORY  Chief Complaint Shoulder Pain   HPI Jeffery Knight. is a 28 y.o. male presents to the ED stating that he woke up with a "knot" on his collarbone.  Patient states that he sleeps on a couch and that there was no injury either prior to going to sleep or while he was sleeping.  Patient denies any previous injury to his clavicle.  He states that he cannot move his arm due to pain.  Patient also complains of some shortness of breath.  He complains of an area on his chest that is "hot".  While talking with him he was asked multiple social questions and patient is positive for IV drug use.  He states that the last time he used was approximately "4 to 8 weeks" ago with heroin being the substance.  He is unaware of any fever or chills.  He rates pain as a 9/10.      History reviewed. No pertinent past medical history.  There are no active problems to display for this patient.   Past Surgical History:  Procedure Laterality Date   left hand surgery       Prior to Admission medications   Medication Sig Start Date End Date Taking? Authorizing Provider  sulfamethoxazole-trimethoprim (BACTRIM DS) 800-160 MG tablet Take 1 tablet by mouth 2 (two) times daily. 11/02/18   Tommi Rumps, PA-C    Allergies Tramadol  No family history on file.  Social History Social History   Tobacco Use   Smoking status: Current Every Day Smoker    Packs/day: 0.50    Types: Cigarettes   Smokeless tobacco: Never Used  Substance Use Topics   Alcohol use: No   Drug use: Yes    Types: Marijuana    Review of Systems Constitutional: No fever/chills Cardiovascular: Denies chest pain. Respiratory: Denies shortness of  breath. Gastrointestinal: No abdominal pain.  No nausea, no vomiting.  No diarrhea.  No constipation. Genitourinary: Negative for dysuria. Musculoskeletal: Negative for muscle aches. Skin: Positive for swelling over the medial clavicle notch. Neurological: Negative for headaches, focal weakness or numbness.  ____________________________________________   PHYSICAL EXAM:  VITAL SIGNS: ED Triage Vitals  Enc Vitals Group     BP 11/02/18 1244 (!) 150/92     Pulse Rate 11/02/18 1244 (!) 107     Resp 11/02/18 1244 19     Temp 11/02/18 1244 98.9 F (37.2 C)     Temp src --      SpO2 11/02/18 1244 99 %     Weight 11/02/18 1239 160 lb (72.6 kg)     Height 11/02/18 1239 5\' 9"  (1.753 m)     Head Circumference --      Peak Flow --      Pain Score 11/02/18 1239 9     Pain Loc --      Pain Edu? --      Excl. in [place]? --     Constitutional: Alert and oriented. Well appearing and in no acute distress. Eyes: Conjunctivae are normal.  Head: Atraumatic. Neck: No stridor.   Cardiovascular: Normal rate, regular rhythm. Grossly normal heart sounds.  Good peripheral circulation. Respiratory: Normal respiratory effort.  No retractions. Lungs CTAB. Gastrointestinal: Soft and nontender. No distention.  Musculoskeletal:  Limited to no movement of the right shoulder secondary to pain.  Patient is able move left upper extremity and bilateral lower extremities without any difficulty.  No gross deformities noted of the shoulder itself however there is a soft tissue erythematous area over the clavicle/sternal notch that is markedly tender to light touch.  Area is moderately warm to touch.  No fluctuance is noted.  Skin is intact. Neurologic:  Normal speech and language. No gross focal neurologic deficits are appreciated. No gait instability. Skin:  Skin is warm, dry and intact.  Erythematous soft tissue lesion as noted above.  Also multiple track marks noted to the upper extremities. Psychiatric: Mood and  affect are normal. Speech and behavior are normal.  ____________________________________________   LABS (all labs ordered are listed, but only abnormal results are displayed)  Labs Reviewed  COMPREHENSIVE METABOLIC PANEL - Abnormal; Notable for the following components:      Result Value   Chloride 95 (*)    Total Protein 8.2 (*)    All other components within normal limits  CBC WITH DIFFERENTIAL/PLATELET - Abnormal; Notable for the following components:   WBC 12.9 (*)    Neutro Abs 9.3 (*)    Monocytes Absolute 1.1 (*)    All other components within normal limits  FIBRIN DERIVATIVES D-DIMER ([facility] ONLY) - Abnormal; Notable for the following components:   Fibrin derivatives D-dimer ([facility]) 552.63 (*)    All other components within normal limits  URINALYSIS, COMPLETE (UACMP) WITH MICROSCOPIC - Abnormal; Notable for the following components:   Color, Urine YELLOW (*)    APPearance CLEAR (*)    Ketones, ur 5 (*)    All other components within normal limits  URINE DRUG SCREEN, QUALITATIVE ([facility] ONLY) - Abnormal; Notable for the following components:   Opiate, Ur Screen POSITIVE (*)    Cannabinoid 50 Ng, Ur [place] POSITIVE (*)    All other components within normal limits  SARS CORONAVIRUS 2 BY RT PCR (HOSPITAL ORDER, PERFORMED IN [facility])  CULTURE, BLOOD (ROUTINE X 2)  CULTURE, BLOOD (ROUTINE X 2)  LACTIC ACID, PLASMA  LACTIC ACID, PLASMA  TROPONIN I (HIGH SENSITIVITY)  TROPONIN I (HIGH SENSITIVITY)   ____________________________________________  EKG  Reviewed by Dr. Dawna Part.  Patient sinus tachycardia with a ventricular rate of 102, PR interval 140, QRS duration 94 ____________________________________________  RADIOLOGY   Official radiology report(s): Dg Shoulder Right  Result Date: 11/02/2018 CLINICAL DATA:  Only able to get two view due to patient condition and patients pain tolerance. Right shoulder pain for two days, no known injury. EXAM: RIGHT SHOULDER -  2+ VIEW COMPARISON:  None. FINDINGS: There is no evidence of fracture or dislocation. There is no evidence of arthropathy or other focal bone abnormality. Soft tissues are unremarkable. IMPRESSION: Negative right shoulder radiographs. Electronically Signed   By: Emmaline Kluver M.D.   On: 11/02/2018 13:37   Ct Angio Chest Pe W And/or Wo Contrast  Result Date: 11/02/2018 CLINICAL DATA:  28 year old male with shortness of breath. Elevated D-dimer. No drug use or. EXAM: CT ANGIOGRAPHY CHEST WITH CONTRAST TECHNIQUE: Multidetector CT imaging of the chest was performed using the standard protocol during bolus administration of intravenous contrast. Multiplanar CT image reconstructions and MIPs were obtained to evaluate the vascular anatomy. CONTRAST:  75mL OMNIPAQUE IOHEXOL 350 MG/ML SOLN COMPARISON:  Chest radiograph dated 11/02/2018 FINDINGS: Cardiovascular: There is no cardiomegaly or pericardial effusion. The thoracic aorta is unremarkable. The origins of the great vessels of the  aortic arch appear patent as visualized. Evaluation of the pulmonary arteries is somewhat limited due to respiratory motion artifact and suboptimal visualization and opacification of the peripheral branches. No definite large or central pulmonary artery embolus identified. Mediastinum/Nodes: No hilar or mediastinal. The esophagus is grossly unremarkable. No mediastinal fluid collection. Lungs/Pleura: The lungs are clear. There is no pleural effusion pneumothorax. The central airways are patent. Upper Abdomen: No acute abnormality. Musculoskeletal: No chest wall abnormality. No acute or significant osseous findings. There is mild induration of the skin and subcutaneous soft tissues over the right clavicular head adjacent to the BB marker. No drainable fluid collection or abscess identified. Review of the MIP images confirms the above findings. IMPRESSION: 1. No acute intrathoracic pathology. No CT evidence of central pulmonary artery  embolus. 2. Mild induration of the skin and subcutaneous soft tissues of the right upper chest wall over the right clavicular head. No drainable fluid collection/abscess. Electronically Signed   By: Anner Crete M.D.   On: 11/02/2018 17:36   Dg Chest Port 1 View  Result Date: 11/02/2018 CLINICAL DATA:  Knot on collarbone. EXAM: PORTABLE CHEST 1 VIEW COMPARISON:  03/16/2015. FINDINGS: Mediastinum hilar structures normal. Lungs are clear. No pleural effusion or pneumothorax. Heart size normal. No acute bony abnormality identified. Right clavicle intact. Thoracic spine scoliosis concave right. IMPRESSION: 1.  No acute cardiopulmonary disease. 2.  No acute focal bony abnormality.  Right clavicle intact. Electronically Signed   By: [place]   On: 11/02/2018 15:33    ____________________________________________   PROCEDURES  Procedure(s) performed (including Critical Care):  Procedures   ____________________________________________   INITIAL IMPRESSION / ASSESSMENT AND PLAN / ED COURSE  As part of my medical decision making, I reviewed the following data within the electronic MEDICAL RECORD NUMBER Notes from prior ED visits and [place] Controlled Substance [place] Mcadory Jr. was evaluated in Emergency Department on 11/02/2018 for the symptoms described in the history of present illness. He was evaluated in the context of the global COVID-19 pandemic, which necessitated consideration that the patient might be at risk for infection with the SARS-CoV-2 virus that causes COVID-19. Institutional protocols and algorithms that pertain to the evaluation of patients at risk for COVID-19 are in a state of rapid change based on information released by regulatory bodies including the CDC and federal and state organizations. These policies and algorithms were followed during the patient's care in the ED.  28 year old male presents to the ED with complaint of a painful deformity on his  collarbone without history of injury.  Initial x-ray of the shoulder was negative for fracture or dislocation.  After further evaluation there is an erythematous, warm and markedly tender area to the medial clavicle/anterior chest wall.  It is this area that prevents patient from moving his arm.  Patient admitted to IV drug use and reported that his last use was approximately 4 weeks ago using heroin.  Patient initially complained of shortness of breath but O2 sats remained consistent at 99%.  D-dimer was elevated at 552 with the remaining blood work negative.  UDS showed positive for opiates and marijuana.  CT scan ruled out PE and soft tissue lesion was evaluated and no localized abscess or fluid was noted.  Patient was seen by Dr. Joan Mayans prior to patient's discharge.  [facility] care for follow-up.  Patient was placed on Bactrim DS twice daily for 10 days and encouraged  to use warm compresses to the area.  Patient was told to return to the ED if any severe worsening of his symptoms or urgent concerns.  Patient was told he could take Tylenol or ibuprofen if needed for pain.  ____________________________________________   FINAL CLINICAL IMPRESSION(S) / ED DIAGNOSES  Final diagnoses:  Cellulitis of chest wall  Acute pain of right shoulder  Shortness of breath  IV drug abuse [facility](HCC)     ED Discharge Orders         Ordered    sulfamethoxazole-trimethoprim (BACTRIM DS) 800-160 MG tablet  2 times daily     11/02/18 1830           Note:  This document was prepared using Dragon voice recognition software and may include unintentional dictation errors.    Tommi RumpsSummers, Ashlyne Olenick L, PA-C 11/02/18 1913    Tommi RumpsSummers, Valentino Saavedra L, PA-C 11/02/18 1939    Miguel AschoffMonks, Sarah L., MD 11/02/18 2033

## 2018-11-02 NOTE — ED Triage Notes (Signed)
Pt in via POV, reports waking up with a "knot" on his collar bone.  Pt with decreased function of right upper extremity.  NAD noted at this time.

## 2018-11-07 LAB — CULTURE, BLOOD (ROUTINE X 2)
Culture: NO GROWTH
Culture: NO GROWTH
Special Requests: ADEQUATE

## 2019-02-10 ENCOUNTER — Encounter: Payer: Self-pay | Admitting: Emergency Medicine

## 2019-02-10 ENCOUNTER — Other Ambulatory Visit: Payer: Self-pay

## 2019-02-10 ENCOUNTER — Emergency Department: Payer: Self-pay

## 2019-02-10 ENCOUNTER — Emergency Department
Admission: EM | Admit: 2019-02-10 | Discharge: 2019-02-10 | Disposition: A | Discharge status: Home / Self Care | Payer: Self-pay | Attending: Student in an Organized Health Care Education/Training Program | Admitting: Student in an Organized Health Care Education/Training Program

## 2019-02-10 DIAGNOSIS — F17210 Nicotine dependence, cigarettes, uncomplicated: Secondary | ICD-10-CM | POA: Insufficient documentation

## 2019-02-10 DIAGNOSIS — S76011A Strain of muscle, fascia and tendon of right hip, initial encounter: Secondary | ICD-10-CM | POA: Insufficient documentation

## 2019-02-10 DIAGNOSIS — Y93H1 Activity, digging, shoveling and raking: Secondary | ICD-10-CM | POA: Insufficient documentation

## 2019-02-10 DIAGNOSIS — Y929 Unspecified place or not applicable: Secondary | ICD-10-CM | POA: Insufficient documentation

## 2019-02-10 DIAGNOSIS — R52 Pain, unspecified: Secondary | ICD-10-CM

## 2019-02-10 DIAGNOSIS — X58XXXA Exposure to other specified factors, initial encounter: Secondary | ICD-10-CM | POA: Insufficient documentation

## 2019-02-10 DIAGNOSIS — Y999 Unspecified external cause status: Secondary | ICD-10-CM | POA: Insufficient documentation

## 2019-02-10 MED ORDER — HYDROCODONE-ACETAMINOPHEN 5-325 MG PO TABS
1.0000 | ORAL_TABLET | Freq: Once | ORAL | Status: AC
  Administered 2019-02-10: 1 via ORAL
  Filled 2019-02-10: qty 1

## 2019-02-10 MED ORDER — HYDROCODONE-ACETAMINOPHEN 5-325 MG PO TABS: 1.0000 | ORAL_TABLET | Freq: Four times a day (QID) | ORAL | 0 refills | Status: DC | PRN

## 2019-02-10 MED ORDER — KETOROLAC TROMETHAMINE 30 MG/ML IJ SOLN
30.0000 mg | Freq: Once | INTRAMUSCULAR | Status: AC
  Administered 2019-02-10: 10:00:00 30 mg via INTRAMUSCULAR
  Filled 2019-02-10: qty 1

## 2019-02-10 MED ORDER — NAPROXEN 500 MG PO TABS: 500.0000 mg | ORAL_TABLET | Freq: Two times a day (BID) | ORAL | 0 refills | Status: DC

## 2019-02-10 NOTE — Discharge Instructions (Signed)
Follow-up with your primary care provider or [facility] acute care if any continued problems.  You may use ice to your hip as needed for discomfort which will help decrease inflammation and help with pain.  Also prescription for naproxen 500 mg twice daily with food was sent to your pharmacy.  The injection that you received should start helping your pain within an hour.  You may also take an additional Tylenol if extra pain medication is needed.

## 2019-02-10 NOTE — ED Triage Notes (Signed)
Patient presents to the ED with right hip pain that began yesterday after raking leaves.  Patient states it is painful to walk.  Patient states pain is, "deep in the bone".

## 2019-02-10 NOTE — ED Provider Notes (Signed)
[facility] Emergency Department Provider Note  ____________________________________________   First MD Initiated Contact with Patient 02/10/19 0900     (approximate)  I have reviewed the triage vital signs and the nursing notes.   HISTORY  Chief Complaint Hip Pain   HPI Jeffery Knight. is a 29 y.o. male presents to the ED with complaint of right groin/hip pain that started yesterday.  Patient states that he was raking leaves for approximately 1 hour.  He denies any injury or fall during that time.  He states that later he began having right groin and hip pain.  He states that he took Tylenol p.m. last evening but has not taken any anti-inflammatories.  He denies any previous injury to his hip.  He reports that walking increases his pain.  He rates his pain as an 8 out of 10.     History reviewed. No pertinent past medical history.  There are no problems to display for this patient.   Past Surgical History:  Procedure Laterality Date  . left hand surgery       Prior to Admission medications   Medication Sig Start Date End Date Taking? Authorizing Provider  HYDROcodone-acetaminophen (NORCO/VICODIN) 5-325 MG tablet Take 1 tablet by mouth every 6 (six) hours as needed for moderate pain. 02/10/19   Johnn Hai, PA-C  naproxen (NAPROSYN) 500 MG tablet Take 1 tablet (500 mg total) by mouth 2 (two) times daily with a meal. 02/10/19   Johnn Hai, PA-C    Allergies Tramadol  No family history on file.  Social History Social History   Tobacco Use  . Smoking status: Current Every Day Smoker    Packs/day: 0.50    Types: Cigarettes  . Smokeless tobacco: Never Used  Substance Use Topics  . Alcohol use: No  . Drug use: Yes    Types: Marijuana    Review of Systems Constitutional: No fever/chills Cardiovascular: Denies chest pain. Respiratory: Denies shortness of breath. Musculoskeletal: Positive right hip pain. Skin: Negative for  rash. Neurological: Negative for  focal weakness or numbness. ____________________________________________   PHYSICAL EXAM:  VITAL SIGNS: ED Triage Vitals  Enc Vitals Group     BP 02/10/19 0848 124/77     Pulse Rate 02/10/19 0848 77     Resp 02/10/19 0848 16     Temp 02/10/19 0848 98.3 F (36.8 C)     Temp Source 02/10/19 0848 Oral     SpO2 02/10/19 0848 100 %     Weight 02/10/19 0843 167 lb (75.8 kg)     Height 02/10/19 0843 5\' 10"  (1.778 m)     Head Circumference --      Peak Flow --      Pain Score 02/10/19 0843 8     Pain Loc --      Pain Edu? --      Excl. in [place]? --     Constitutional: Alert and oriented. Well appearing and in no acute distress. Eyes: Conjunctivae are normal.  Head: Atraumatic. Neck: No stridor.   Cardiovascular: Normal rate, regular rhythm. Grossly normal heart sounds.  Good peripheral circulation. Respiratory: Normal respiratory effort.  No retractions. Lungs CTAB. Gastrointestinal: Soft and nontender. No distention.  Musculoskeletal: On examination of his right hip there is no gross deformity no soft tissue edema or discoloration noted.  There is point tenderness on palpation of the anterior lateral pelvic area and lateral right hip area.  Range of motion is moderately restricted  secondary to patient's pain. Neurologic:  Normal speech and language. No gross focal neurologic deficits are appreciated. No gait instability. Skin:  Skin is warm, dry and intact.  No abrasions or discoloration noted. Psychiatric: Mood and affect are normal. Speech and behavior are normal.  ____________________________________________   LABS (all labs ordered are listed, but only abnormal results are displayed)  Labs Reviewed - No data to display  RADIOLOGY  Official radiology report(s): DG HIP UNILAT WITH PELVIS 2-3 VIEWS RIGHT  Result Date: 02/10/2019 CLINICAL DATA:  Yesterday onset right hip and groin after pain raking leaves. EXAM: DG HIP (WITH OR WITHOUT  PELVIS) 2-3V RIGHT COMPARISON:  None. FINDINGS: There is no evidence of hip fracture or dislocation. There is no evidence of arthropathy or other focal bone abnormality. IMPRESSION: Negative exam. Electronically Signed   By: Drusilla Kanner M.D.   On: 02/10/2019 09:41    ____________________________________________   PROCEDURES  Procedure(s) performed (including Critical Care):  Procedures  ____________________________________________   INITIAL IMPRESSION / ASSESSMENT AND PLAN / ED COURSE  As part of my medical decision making, I reviewed the following data within the electronic MEDICAL RECORD NUMBER Notes from prior ED visits and [place] Controlled Substance Database  29 year old male presents to the ED with complaint of right hip and groin pain that began yesterday.  Patient states that he rate a yard yesterday for approximately 45 minutes to an hour and later in the day began having pain in the area.  He states he took a Tylenol PM to allow him to sleep.  He states is very painful to walk today.  Range of motion is restricted secondary to patient's pain.  No evidence or history of fall or injury directly to his hip.  Patient was given a Toradol injection and hip x-ray was negative.  Patient was also given Norco while in the ED for continued pain.  Patient was discharged with a prescription for naproxen 500 mg twice daily and Norco every 6 hours as needed for pain.  He is encouraged to use ice to his hip as needed for discomfort.  He is to follow-up with his PCP or return to the emergency department if any severe worsening of his symptoms.  ____________________________________________   FINAL CLINICAL IMPRESSION(S) / ED DIAGNOSES  Final diagnoses:  Hip strain, right, initial encounter     ED Discharge Orders         Ordered    naproxen (NAPROSYN) 500 MG tablet  2 times daily with meals     02/10/19 1002    HYDROcodone-acetaminophen (NORCO/VICODIN) 5-325 MG tablet  Every 6 hours PRN      02/10/19 1030           Note:  This document was prepared using Dragon voice recognition software and may include unintentional dictation errors.    Tommi Rumps, PA-C 02/10/19 1121    Willy Eddy, MD 02/10/19 1148

## 2019-02-10 NOTE — ED Notes (Signed)
See triage note  Presents with pain to right groin /hip area   States he was helping in the yard yesterday  Developed pain after raking leaves  Denies any fall  Increased pain with ambulation and movement

## 2019-07-19 ENCOUNTER — Telehealth: Payer: Self-pay | Admitting: General Practice

## 2019-07-19 NOTE — Telephone Encounter (Signed)
Individual has been contacted 3+ times regarding ED referral and has been given information regarding how to become a pt. No further attempts to contact individual will be made. 

## 2020-11-17 IMAGING — CR DG SHOULDER 2+V*R*
2 series · 2 of 2 positions shown · non-contrast
Comparison: None.

CLINICAL DATA: Only able to get two view due to patient condition
and patients pain tolerance. Right shoulder pain for two days, no
known injury.

EXAM:
RIGHT SHOULDER - 2+ VIEW

[shoulder grashey]
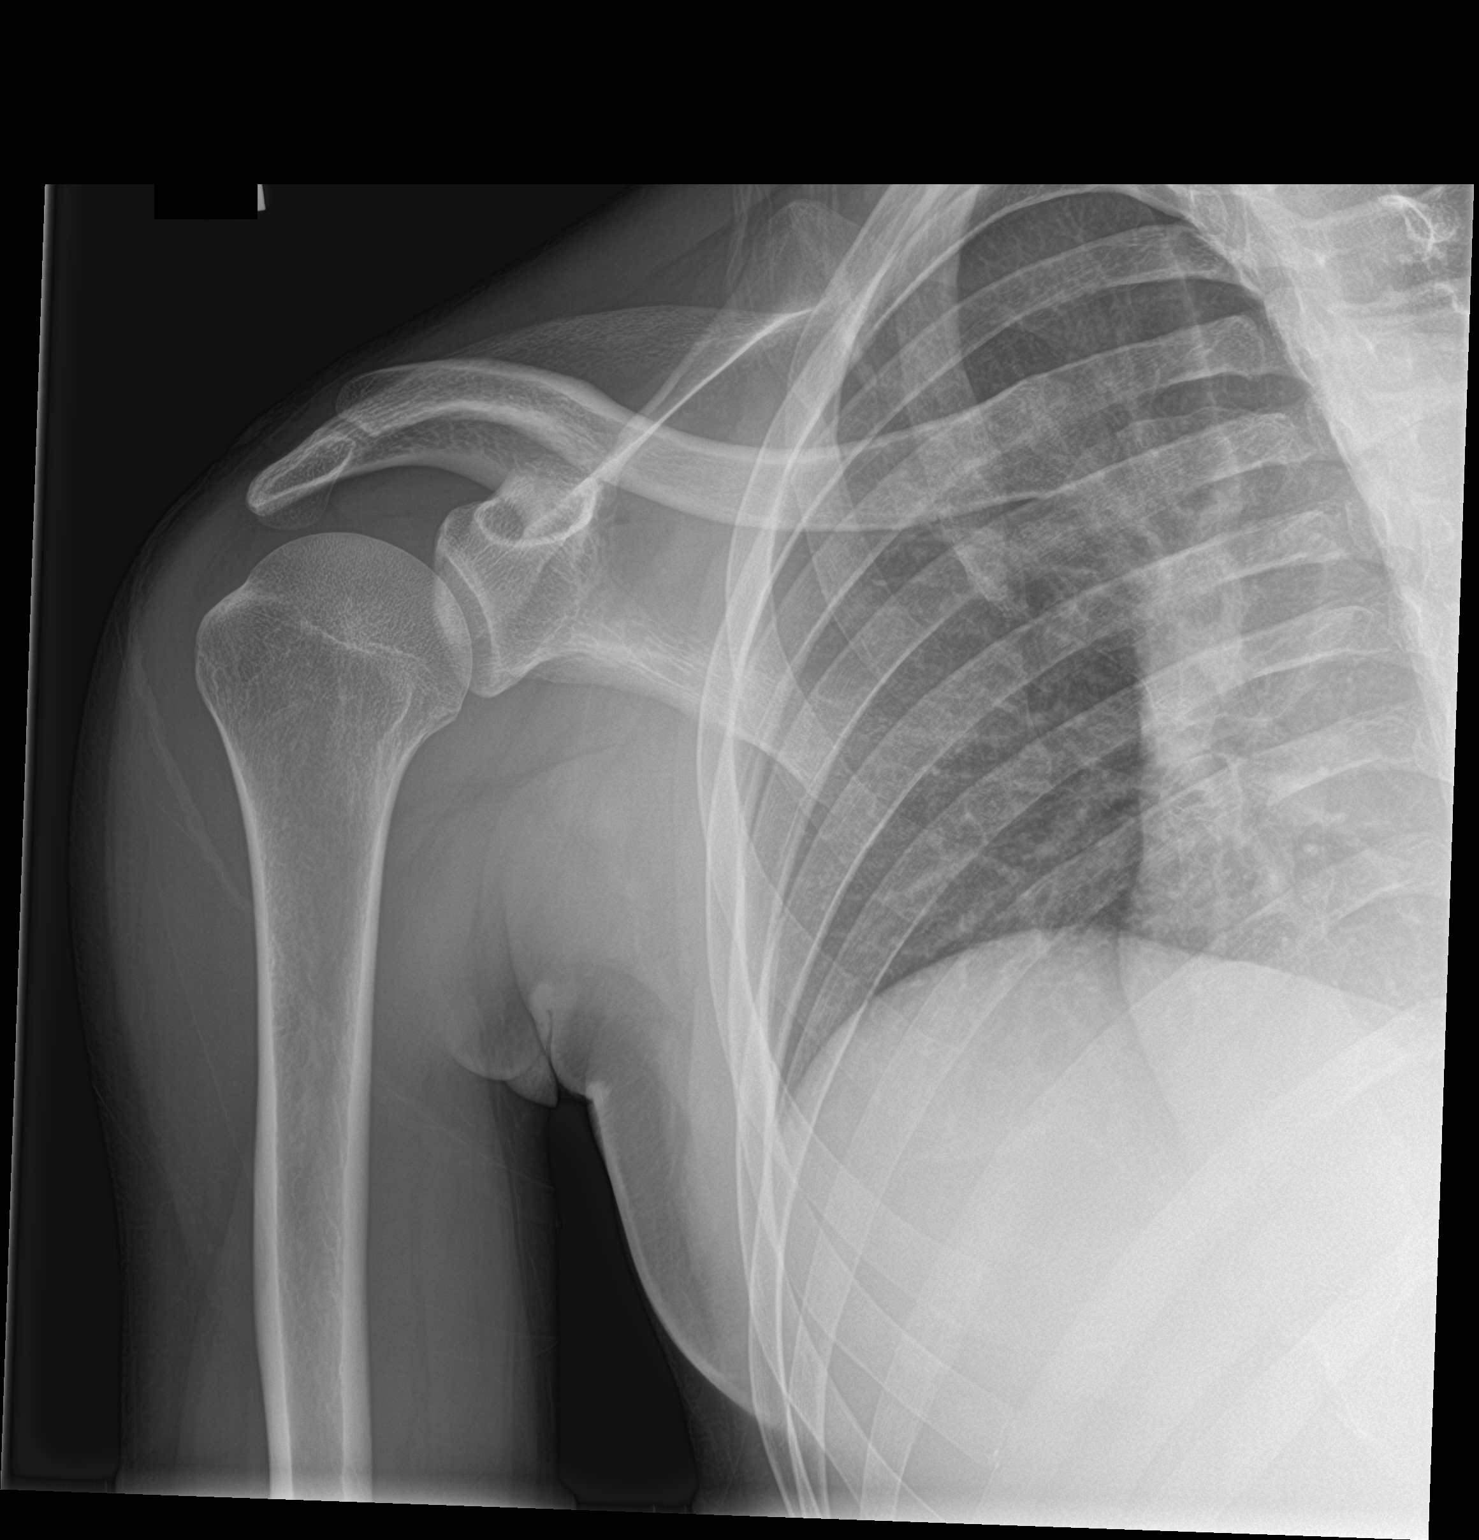

[shoulder y view]
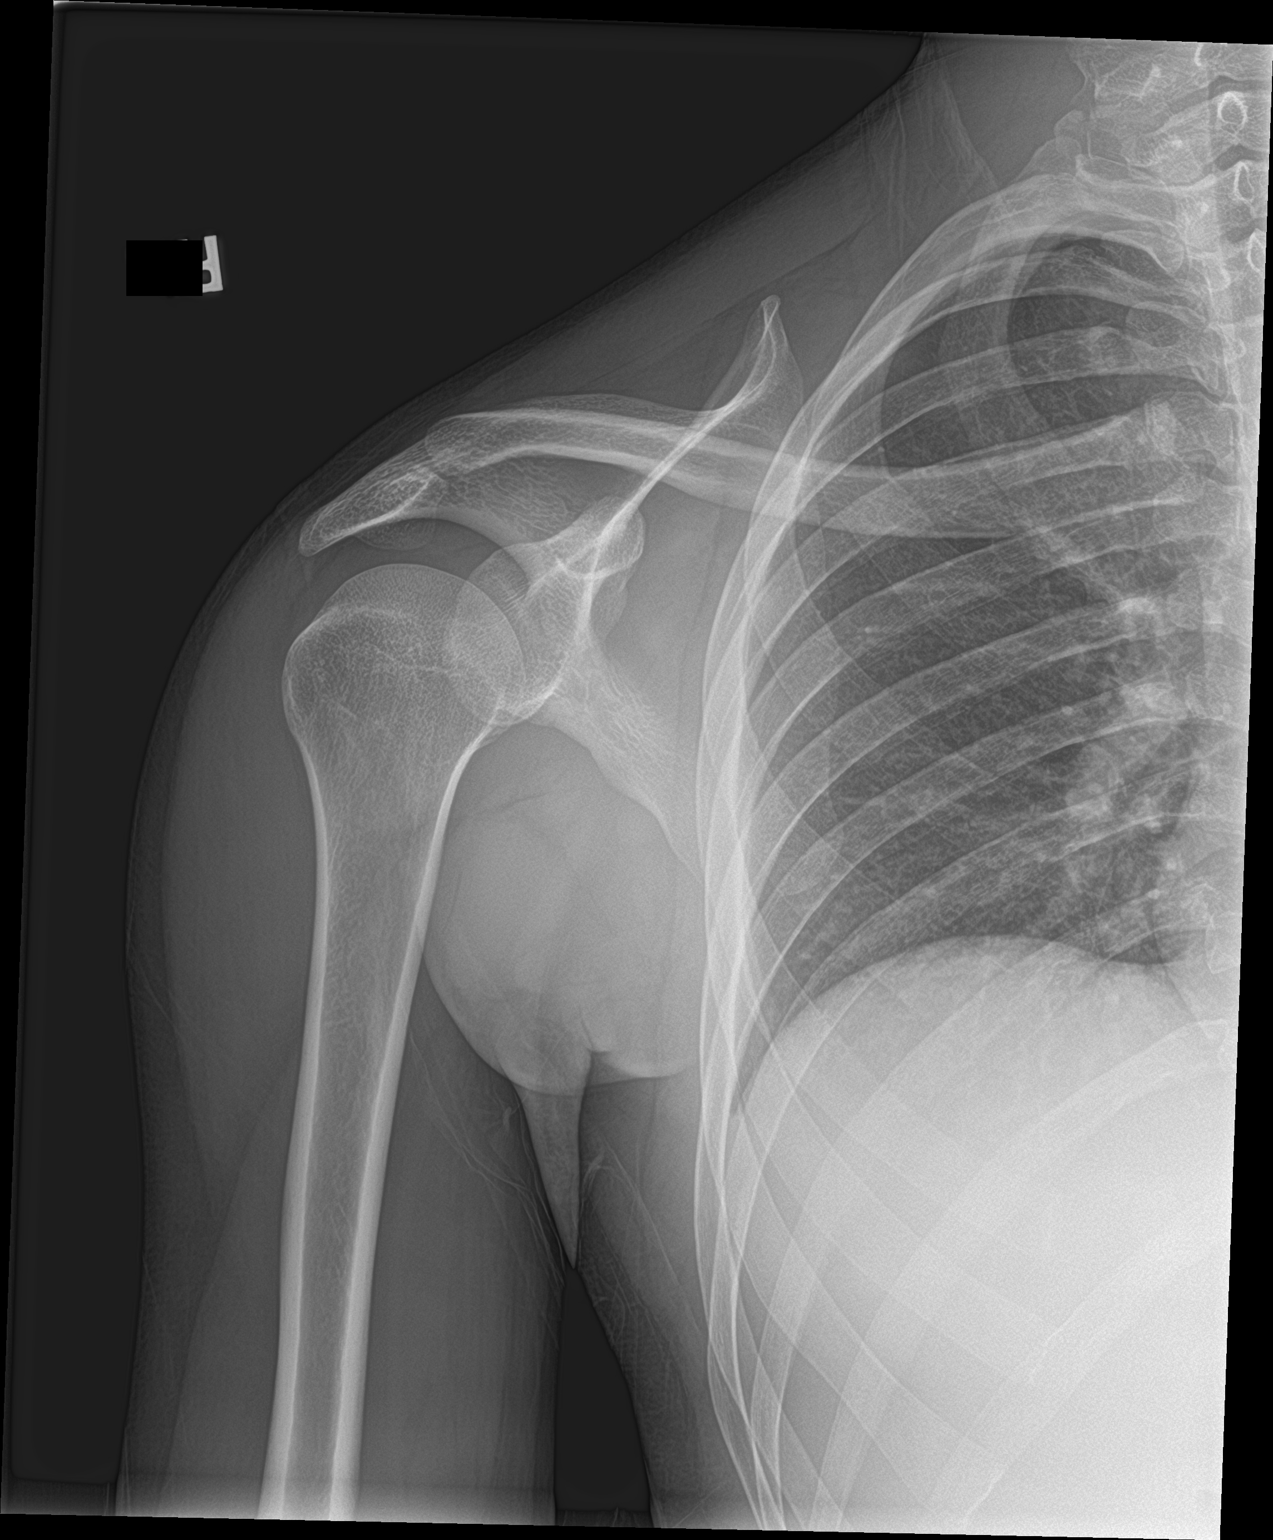

[2 of 2 positions shown; findings below may reference images not displayed]

FINDINGS: There is no evidence of fracture or dislocation. There is no
evidence of arthropathy or other focal bone abnormality. Soft
tissues are unremarkable.
IMPRESSION: Negative right shoulder radiographs.

## 2020-11-17 IMAGING — DX DG CHEST 1V PORT
1 series · 1 of 1 positions shown · non-contrast
Comparison: 03/16/2015.

CLINICAL DATA: Knot on collarbone.

EXAM:
PORTABLE CHEST 1 VIEW

[chest ap]
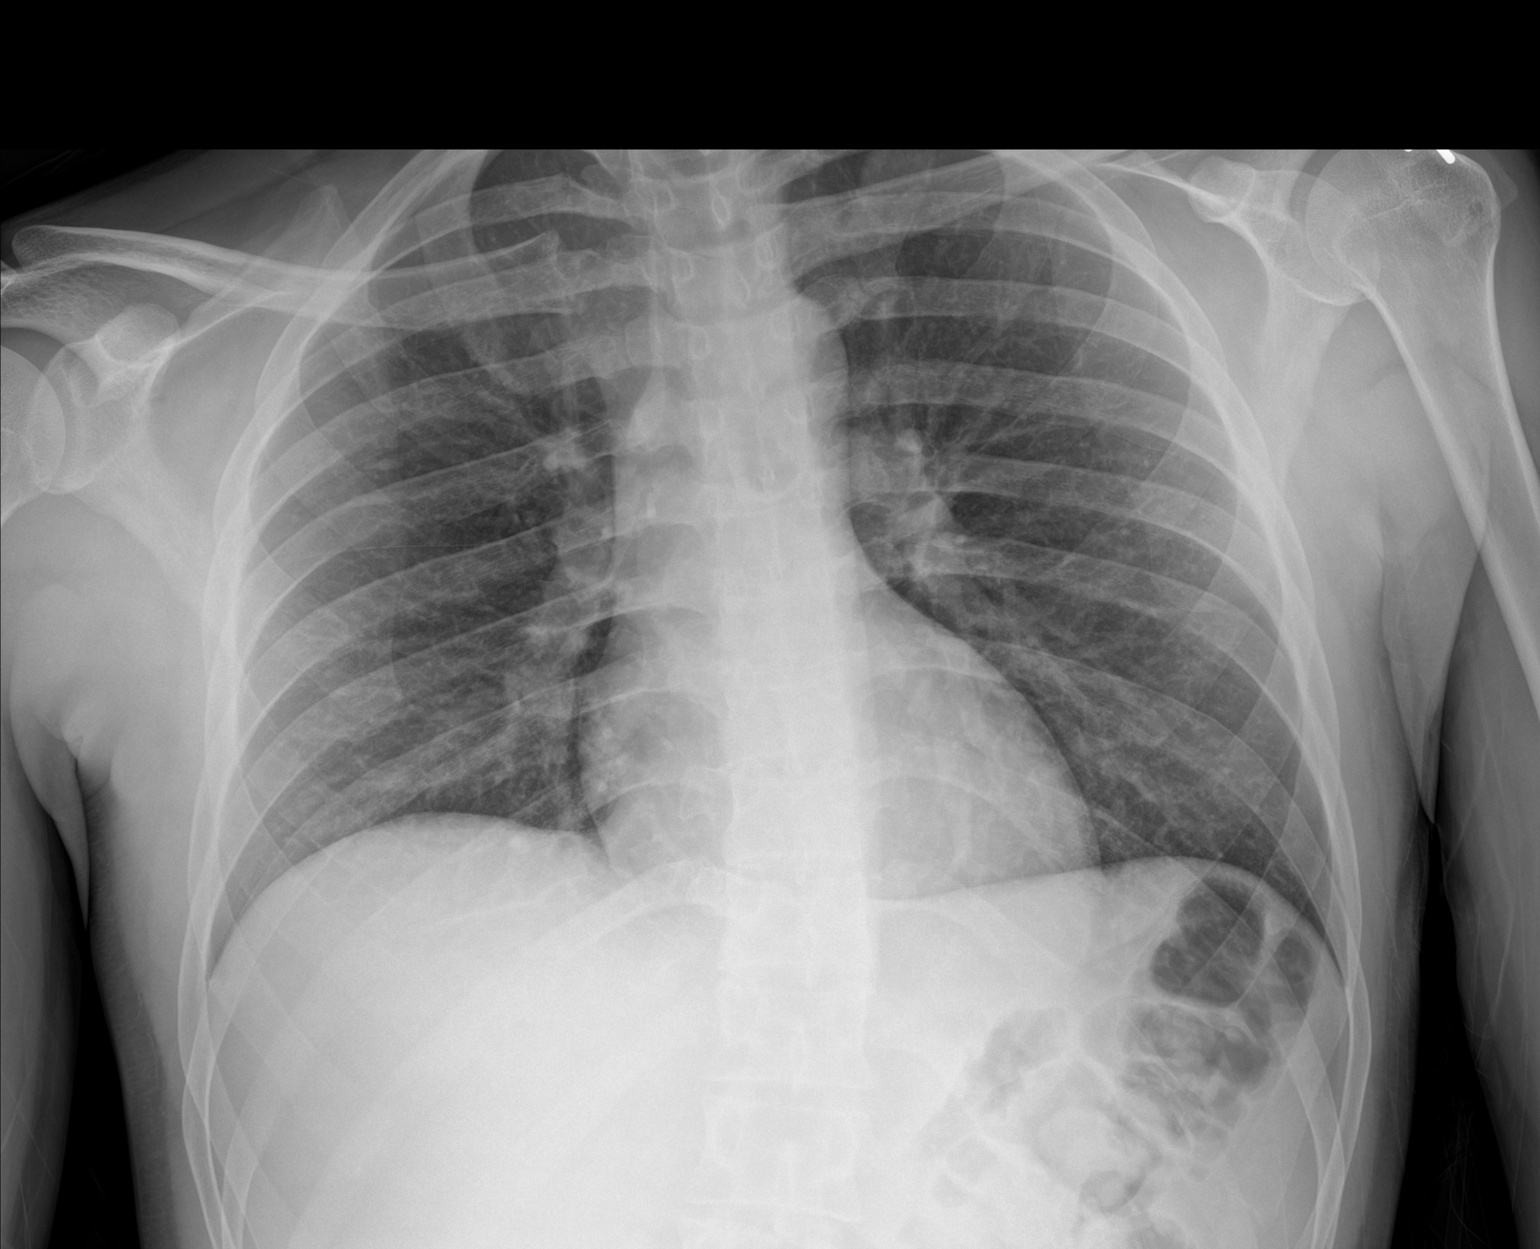

[1 of 1 positions shown; findings below may reference images not displayed]

FINDINGS: Mediastinum hilar structures normal. Lungs are clear. No pleural
effusion or pneumothorax. Heart size normal. No acute bony
abnormality identified. Right clavicle intact. Thoracic spine
scoliosis concave right.
IMPRESSION: 1.  No acute cardiopulmonary disease.

2.  No acute focal bony abnormality.  Right clavicle intact.

## 2021-02-25 IMAGING — CR DG HIP (WITH OR WITHOUT PELVIS) 2-3V*R*
1 series · 4 of 4 positions shown · non-contrast
Comparison: None.

CLINICAL DATA: Yesterday onset right hip and groin after pain
raking leaves.

EXAM:
DG HIP (WITH OR WITHOUT PELVIS) 2-3V RIGHT

[Series 1: dg hip unilat w or w/o pelvis 2-3 views  · non-contrast · 0.14mm/px · 4 of 4 slices shown]
[im 1/4]
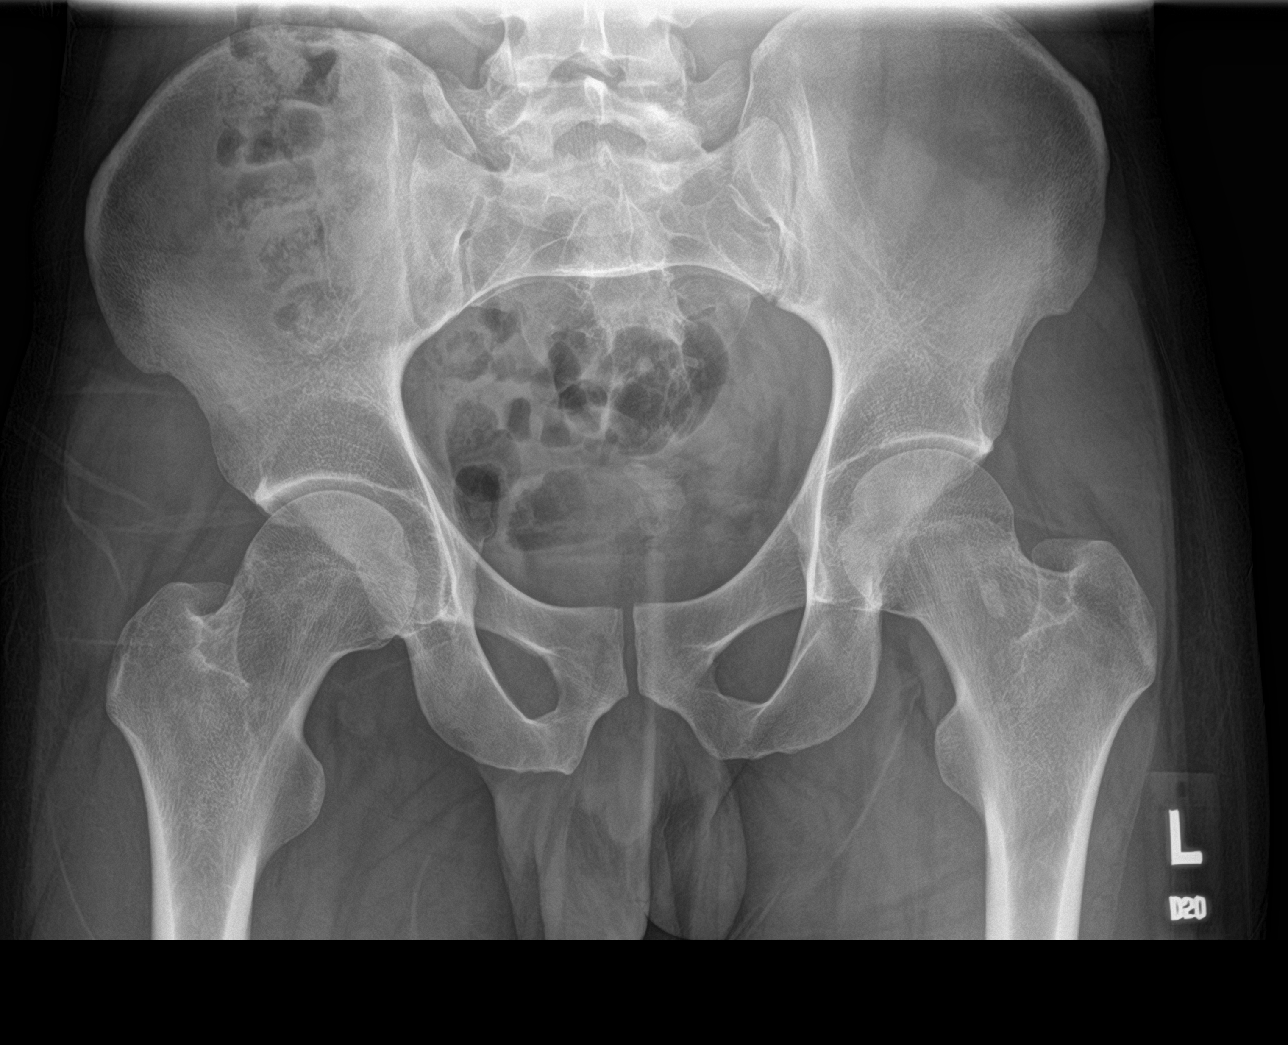
[im 2/4]
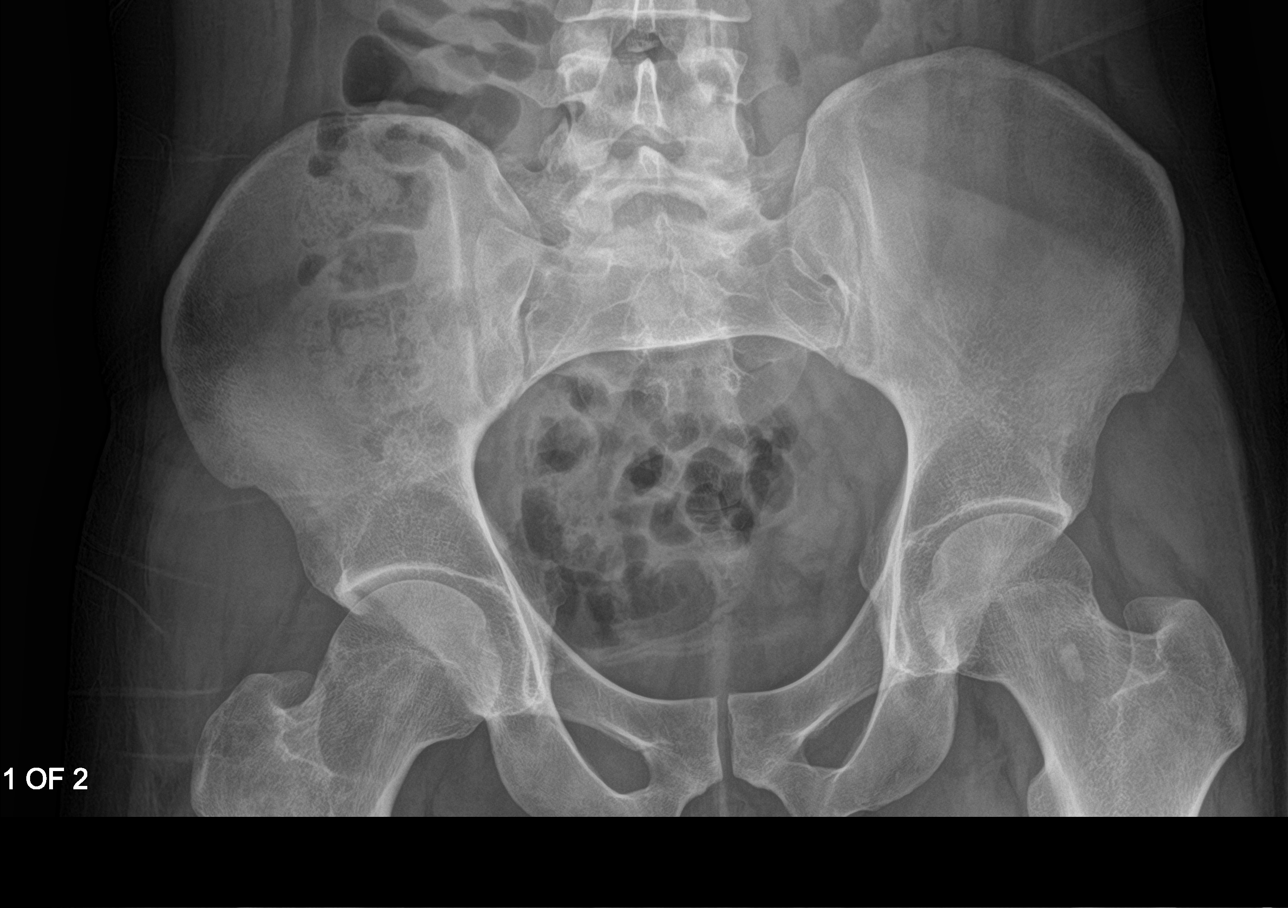
[im 3/4]
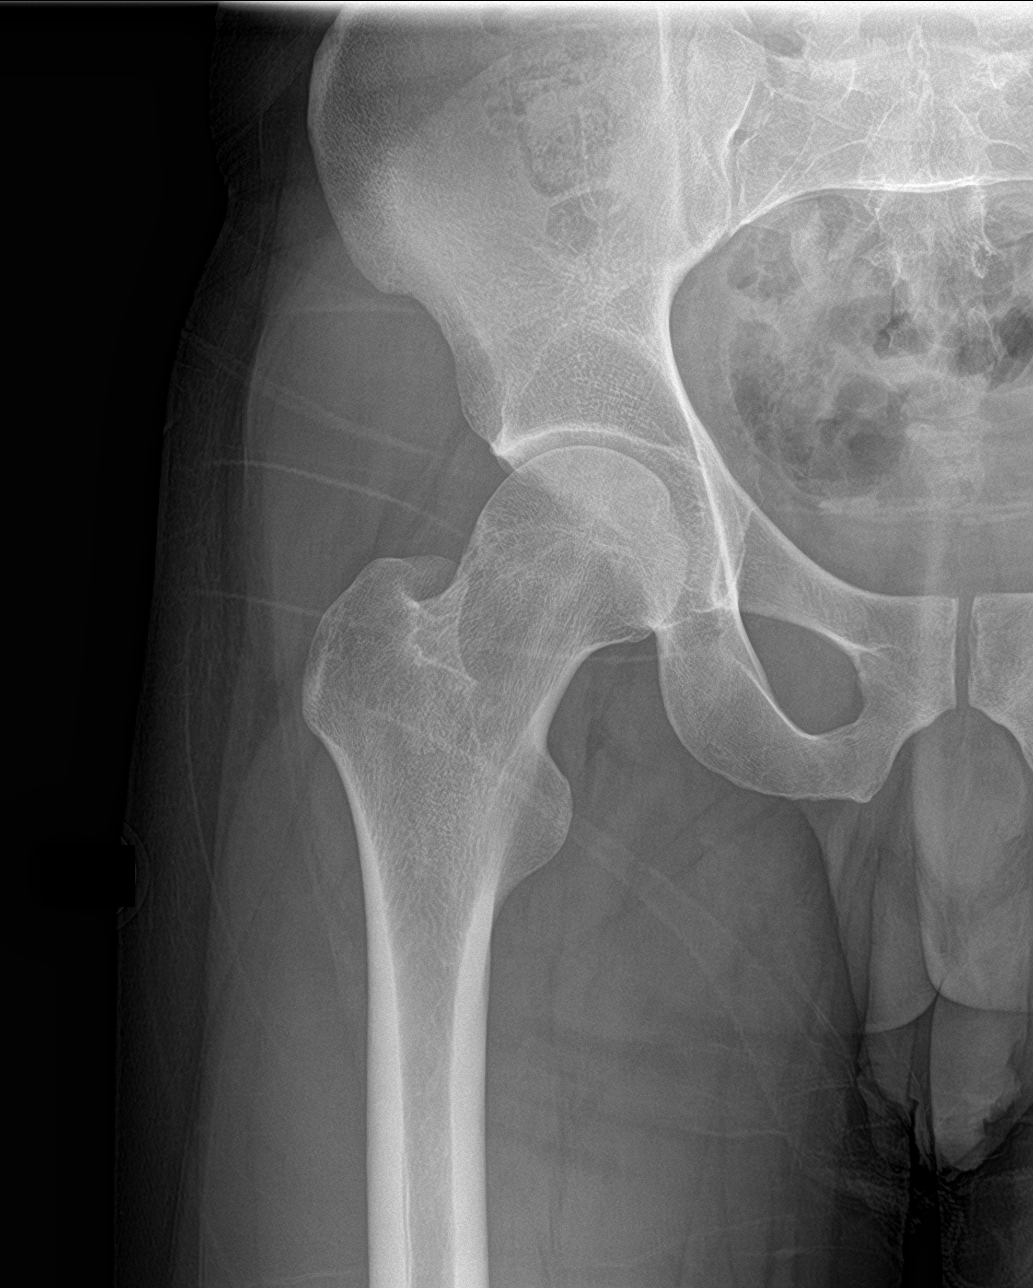
[im 4/4]
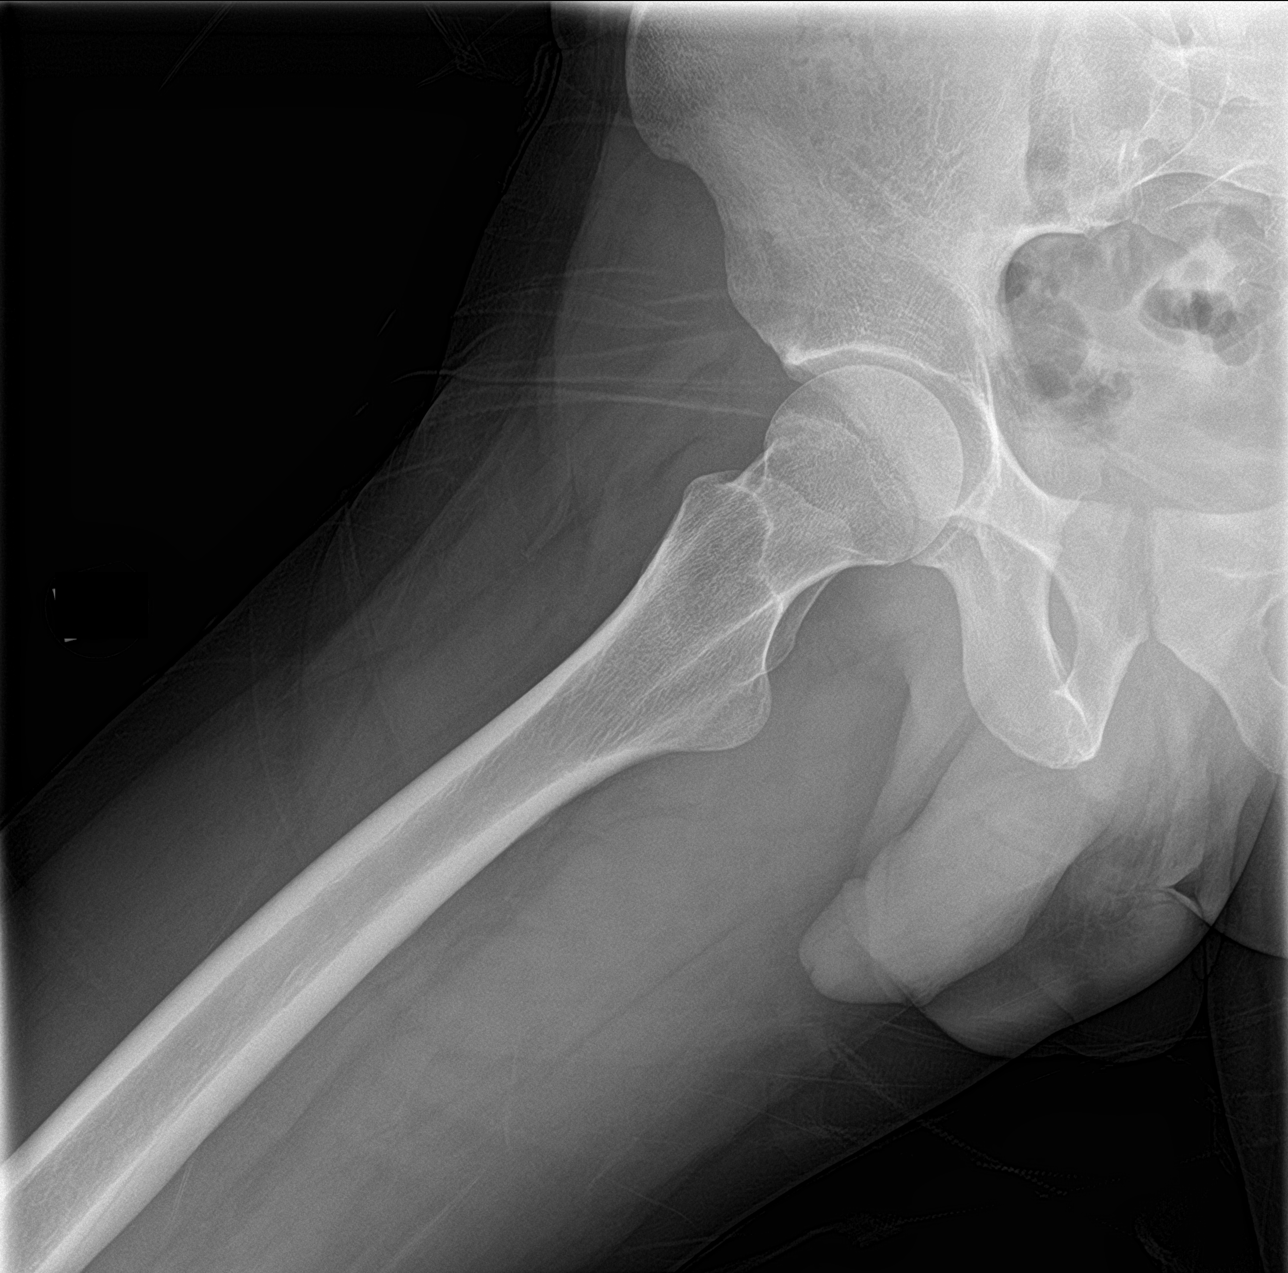

[4 of 4 positions shown; findings below may reference images not displayed]

FINDINGS: There is no evidence of hip fracture or dislocation. There is no
evidence of arthropathy or other focal bone abnormality.
IMPRESSION: Negative exam.

## 2022-11-15 ENCOUNTER — Encounter
Admission: EM | Discharge status: Against Medical Advice | Payer: Self-pay | Source: Home / Self Care | Attending: Internal Medicine

## 2022-11-15 ENCOUNTER — Other Ambulatory Visit: Payer: Self-pay

## 2022-11-15 ENCOUNTER — Inpatient Hospital Stay
Admission: EM | Admit: 2022-11-15 | Discharge: 2022-11-16 | DRG: 872 | Discharge status: Against Medical Advice | Payer: 59 | Attending: Student | Admitting: Student

## 2022-11-15 ENCOUNTER — Emergency Department: Payer: 59

## 2022-11-15 ENCOUNTER — Inpatient Hospital Stay: Payer: 59 | Admitting: Anesthesiology

## 2022-11-15 ENCOUNTER — Emergency Department (HOSPITAL_COMMUNITY)
Admit: 2022-11-15 | Discharge: 2022-11-15 | Disposition: A | Discharge status: Home / Self Care | Payer: 59 | Attending: Internal Medicine | Admitting: Internal Medicine

## 2022-11-15 DIAGNOSIS — F1110 Opioid abuse, uncomplicated: Secondary | ICD-10-CM

## 2022-11-15 DIAGNOSIS — K529 Noninfective gastroenteritis and colitis, unspecified: Secondary | ICD-10-CM | POA: Diagnosis present

## 2022-11-15 DIAGNOSIS — M62838 Other muscle spasm: Secondary | ICD-10-CM | POA: Diagnosis present

## 2022-11-15 DIAGNOSIS — Z1152 Encounter for screening for COVID-19: Secondary | ICD-10-CM | POA: Diagnosis not present

## 2022-11-15 DIAGNOSIS — Z885 Allergy status to narcotic agent status: Secondary | ICD-10-CM

## 2022-11-15 DIAGNOSIS — E039 Hypothyroidism, unspecified: Secondary | ICD-10-CM | POA: Diagnosis present

## 2022-11-15 DIAGNOSIS — H5704 Mydriasis: Secondary | ICD-10-CM | POA: Diagnosis present

## 2022-11-15 DIAGNOSIS — E876 Hypokalemia: Secondary | ICD-10-CM | POA: Diagnosis present

## 2022-11-15 DIAGNOSIS — I38 Endocarditis, valve unspecified: Secondary | ICD-10-CM

## 2022-11-15 DIAGNOSIS — E0590 Thyrotoxicosis, unspecified without thyrotoxic crisis or storm: Secondary | ICD-10-CM | POA: Diagnosis present

## 2022-11-15 DIAGNOSIS — Z5329 Procedure and treatment not carried out because of patient's decision for other reasons: Secondary | ICD-10-CM | POA: Diagnosis present

## 2022-11-15 DIAGNOSIS — Z7151 Drug abuse counseling and surveillance of drug abuser: Secondary | ICD-10-CM | POA: Diagnosis not present

## 2022-11-15 DIAGNOSIS — F1113 Opioid abuse with withdrawal: Secondary | ICD-10-CM | POA: Diagnosis present

## 2022-11-15 DIAGNOSIS — L0291 Cutaneous abscess, unspecified: Secondary | ICD-10-CM | POA: Diagnosis not present

## 2022-11-15 DIAGNOSIS — F17210 Nicotine dependence, cigarettes, uncomplicated: Secondary | ICD-10-CM | POA: Diagnosis present

## 2022-11-15 DIAGNOSIS — A419 Sepsis, unspecified organism: Principal | ICD-10-CM | POA: Diagnosis present

## 2022-11-15 DIAGNOSIS — F1213 Cannabis abuse with withdrawal: Secondary | ICD-10-CM | POA: Diagnosis present

## 2022-11-15 DIAGNOSIS — F1910 Other psychoactive substance abuse, uncomplicated: Secondary | ICD-10-CM

## 2022-11-15 DIAGNOSIS — L02414 Cutaneous abscess of left upper limb: Secondary | ICD-10-CM | POA: Diagnosis present

## 2022-11-15 DIAGNOSIS — R002 Palpitations: Secondary | ICD-10-CM | POA: Diagnosis present

## 2022-11-15 DIAGNOSIS — L03114 Cellulitis of left upper limb: Secondary | ICD-10-CM | POA: Diagnosis present

## 2022-11-15 DIAGNOSIS — R6889 Other general symptoms and signs: Principal | ICD-10-CM

## 2022-11-15 HISTORY — PX: I & D EXTREMITY: SHX5045

## 2022-11-15 LAB — COMPREHENSIVE METABOLIC PANEL
ALT: 27 U/L (ref 0–44)
AST: 26 U/L (ref 15–41)
Albumin: 4.1 g/dL (ref 3.5–5.0)
Alkaline Phosphatase: 76 U/L (ref 38–126)
Anion gap: 11 (ref 5–15)
BUN: 8 mg/dL (ref 6–20)
CO2: 25 mmol/L (ref 22–32)
Calcium: 9.3 mg/dL (ref 8.9–10.3)
Chloride: 101 mmol/L (ref 98–111)
Creatinine, Ser: 0.78 mg/dL (ref 0.61–1.24)
GFR, Estimated: >60 mL/min (ref >60)
Glucose, Bld: 126 mg/dL — ABNORMAL HIGH (ref 70–99)
Potassium: 3.4 mmol/L — ABNORMAL LOW (ref 3.5–5.1)
Sodium: 137 mmol/L (ref 135–145)
Total Bilirubin: 1 mg/dL (ref 0.3–1.2)
Total Protein: 8.4 g/dL — ABNORMAL HIGH (ref 6.5–8.1)

## 2022-11-15 LAB — GASTROINTESTINAL PANEL BY PCR, STOOL (REPLACES STOOL CULTURE)

## 2022-11-15 LAB — LACTIC ACID, PLASMA
Lactic Acid, Venous: 3 mmol/L (ref 0.5–1.9)
Lactic Acid, Venous: 2.9 mmol/L (ref 0.5–1.9)

## 2022-11-15 LAB — CBC
HCT: 46.9 % (ref 39.0–52.0)
Hemoglobin: 16.1 g/dL (ref 13.0–17.0)
MCH: 28.5 pg (ref 26.0–34.0)
MCHC: 34.3 g/dL (ref 30.0–36.0)
MCV: 83.2 fL (ref 80.0–100.0)
Platelets: 362 10*3/uL (ref 150–400)
RBC: 5.64 MIL/uL (ref 4.22–5.81)
RDW: 12.4 % (ref 11.5–15.5)
WBC: 17.2 10*3/uL — ABNORMAL HIGH (ref 4.0–10.5)
nRBC: 0 % (ref 0.0–0.2)

## 2022-11-15 LAB — RESP PANEL BY RT-PCR (RSV, FLU A&B, COVID)  RVPGX2
Influenza A by PCR: NEGATIVE
Influenza B by PCR: NEGATIVE
Resp Syncytial Virus by PCR: NEGATIVE
SARS Coronavirus 2 by RT PCR: NEGATIVE

## 2022-11-15 LAB — URINALYSIS, ROUTINE W REFLEX MICROSCOPIC
Bilirubin Urine: NEGATIVE
Glucose, UA: NEGATIVE mg/dL
Hgb urine dipstick: NEGATIVE
Ketones, ur: 80 mg/dL — AB
Leukocytes,Ua: NEGATIVE
Nitrite: NEGATIVE
Protein, ur: NEGATIVE mg/dL
Specific Gravity, Urine: 1.02 (ref 1.005–1.030)
pH: 6 (ref 5.0–8.0)

## 2022-11-15 LAB — URINE DRUG SCREEN, QUALITATIVE (ARMC ONLY)
Amphetamines, Ur Screen: NOT DETECTED
Barbiturates, Ur Screen: NOT DETECTED
Benzodiazepine, Ur Scrn: NOT DETECTED
Cannabinoid 50 Ng, Ur ~~LOC~~: POSITIVE — AB
Cocaine Metabolite,Ur ~~LOC~~: NOT DETECTED
MDMA (Ecstasy)Ur Screen: NOT DETECTED
Methadone Scn, Ur: NOT DETECTED
Opiate, Ur Screen: POSITIVE — AB
Phencyclidine (PCP) Ur S: NOT DETECTED
Tricyclic, Ur Screen: NOT DETECTED

## 2022-11-15 LAB — C DIFFICILE QUICK SCREEN W PCR REFLEX
C Diff antigen: NEGATIVE
C Diff interpretation: NOT DETECTED
C Diff toxin: NEGATIVE

## 2022-11-15 LAB — ECHOCARDIOGRAM COMPLETE
AR max vel: 2.79 cm2
AV Peak grad: 12.7 mm[Hg]
Ao pk vel: 1.78 m/s
Area-P 1/2: 4.86 cm2
Height: 70 in
S' Lateral: 2.1 cm
Weight: 2352 [oz_av]

## 2022-11-15 LAB — TROPONIN I (HIGH SENSITIVITY): Troponin I (High Sensitivity): 91 ng/L — ABNORMAL HIGH (ref <18)

## 2022-11-15 LAB — LIPASE, BLOOD: Lipase: 24 U/L (ref 11–51)

## 2022-11-15 LAB — T4, FREE: Free T4: 1.34 ng/dL — ABNORMAL HIGH (ref 0.61–1.12)

## 2022-11-15 SURGERY — IRRIGATION AND DEBRIDEMENT EXTREMITY
Anesthesia: General | Site: Arm Lower | Laterality: Left

## 2022-11-15 MED ORDER — KETAMINE HCL 50 MG/5ML IJ SOSY
PREFILLED_SYRINGE | INTRAMUSCULAR | Status: AC
  Filled 2022-11-15: qty 5

## 2022-11-15 MED ORDER — ACETAMINOPHEN 325 MG PO TABS: 325.0000 mg | ORAL_TABLET | Freq: Four times a day (QID) | ORAL | Status: DC | PRN

## 2022-11-15 MED ORDER — FENTANYL CITRATE (PF) 100 MCG/2ML IJ SOLN
INTRAMUSCULAR | Status: DC | PRN
  Administered 2022-11-15 (×2): 100 ug via INTRAVENOUS

## 2022-11-15 MED ORDER — ACETAMINOPHEN 500 MG PO TABS
1000.0000 mg | ORAL_TABLET | Freq: Four times a day (QID) | ORAL | Status: AC
  Administered 2022-11-15 – 2022-11-16 (×4): 1000 mg via ORAL
  Filled 2022-11-15 (×4): qty 2

## 2022-11-15 MED ORDER — VANCOMYCIN HCL 1500 MG/300ML IV SOLN
1500.0000 mg | Freq: Once | INTRAVENOUS | Status: AC
  Administered 2022-11-15: 1500 mg via INTRAVENOUS
  Filled 2022-11-15: qty 300

## 2022-11-15 MED ORDER — LIDOCAINE HCL (CARDIAC) PF 100 MG/5ML IV SOSY
PREFILLED_SYRINGE | INTRAVENOUS | Status: DC | PRN
  Administered 2022-11-15: 60 mg via INTRAVENOUS

## 2022-11-15 MED ORDER — CEFTRIAXONE SODIUM 2 G IJ SOLR
2.0000 g | Freq: Once | INTRAMUSCULAR | Status: AC
  Administered 2022-11-15: 2 g via INTRAVENOUS
  Filled 2022-11-15: qty 20

## 2022-11-15 MED ORDER — VANCOMYCIN HCL 750 MG/150ML IV SOLN
750.0000 mg | Freq: Three times a day (TID) | INTRAVENOUS | Status: DC
  Administered 2022-11-15 – 2022-11-16 (×4): 750 mg via INTRAVENOUS
  Filled 2022-11-15 (×6): qty 150

## 2022-11-15 MED ORDER — FENTANYL CITRATE (PF) 100 MCG/2ML IJ SOLN
INTRAMUSCULAR | Status: AC
  Filled 2022-11-15: qty 2

## 2022-11-15 MED ORDER — VANCOMYCIN HCL IN DEXTROSE 1-5 GM/200ML-% IV SOLN: 1000.0000 mg | Freq: Once | INTRAVENOUS | Status: DC

## 2022-11-15 MED ORDER — ACETAMINOPHEN 325 MG PO TABS
650.0000 mg | ORAL_TABLET | ORAL | Status: DC
  Administered 2022-11-15: 650 mg via ORAL
  Filled 2022-11-15: qty 2

## 2022-11-15 MED ORDER — ONDANSETRON HCL 4 MG/2ML IJ SOLN
INTRAMUSCULAR | Status: AC
  Filled 2022-11-15: qty 2

## 2022-11-15 MED ORDER — POTASSIUM CHLORIDE 10 MEQ/100ML IV SOLN
10.0000 meq | INTRAVENOUS | Status: AC
  Administered 2022-11-15: 10 meq via INTRAVENOUS
  Filled 2022-11-15 (×3): qty 100

## 2022-11-15 MED ORDER — ONDANSETRON HCL 4 MG/2ML IJ SOLN
INTRAMUSCULAR | Status: DC | PRN
  Administered 2022-11-15: 4 mg via INTRAVENOUS

## 2022-11-15 MED ORDER — SODIUM CHLORIDE 0.9 % IV SOLN: INTRAVENOUS | Status: AC

## 2022-11-15 MED ORDER — TRAZODONE HCL 50 MG PO TABS
50.0000 mg | ORAL_TABLET | Freq: Once | ORAL | Status: AC
  Administered 2022-11-15: 50 mg via ORAL
  Filled 2022-11-15: qty 1

## 2022-11-15 MED ORDER — KETOROLAC TROMETHAMINE 15 MG/ML IJ SOLN
15.0000 mg | Freq: Four times a day (QID) | INTRAMUSCULAR | Status: AC
  Administered 2022-11-15 – 2022-11-16 (×4): 15 mg via INTRAVENOUS
  Filled 2022-11-15 (×4): qty 1

## 2022-11-15 MED ORDER — BISACODYL 10 MG RE SUPP: 10.0000 mg | Freq: Every day | RECTAL | Status: DC | PRN

## 2022-11-15 MED ORDER — SUCCINYLCHOLINE CHLORIDE 200 MG/10ML IV SOSY
PREFILLED_SYRINGE | INTRAVENOUS | Status: DC | PRN
  Administered 2022-11-15: 80 mg via INTRAVENOUS

## 2022-11-15 MED ORDER — ONDANSETRON HCL 4 MG/2ML IJ SOLN
4.0000 mg | Freq: Once | INTRAMUSCULAR | Status: AC
  Administered 2022-11-15: 4 mg via INTRAVENOUS

## 2022-11-15 MED ORDER — METOCLOPRAMIDE HCL 5 MG/ML IJ SOLN
5.0000 mg | Freq: Three times a day (TID) | INTRAMUSCULAR | Status: DC | PRN
  Filled 2022-11-15: qty 2

## 2022-11-15 MED ORDER — FENTANYL CITRATE (PF) 100 MCG/2ML IJ SOLN
25.0000 ug | INTRAMUSCULAR | Status: DC | PRN
Start: 2022-11-15 — End: 2022-11-15

## 2022-11-15 MED ORDER — BUPRENORPHINE HCL 8 MG SL SUBL: 4.0000 mg | SUBLINGUAL_TABLET | SUBLINGUAL | Status: DC

## 2022-11-15 MED ORDER — ONDANSETRON HCL 4 MG/2ML IJ SOLN
INTRAMUSCULAR | Status: AC
Start: 2022-11-15 — End: ?
  Filled 2022-11-15: qty 2

## 2022-11-15 MED ORDER — 0.9 % SODIUM CHLORIDE (POUR BTL) OPTIME
TOPICAL | Status: DC | PRN
  Administered 2022-11-15: 1000 mL

## 2022-11-15 MED ORDER — DIPHENHYDRAMINE HCL 50 MG/ML IJ SOLN
INTRAMUSCULAR | Status: DC | PRN
  Administered 2022-11-15: 25 mg via INTRAVENOUS

## 2022-11-15 MED ORDER — DEXAMETHASONE SODIUM PHOSPHATE 10 MG/ML IJ SOLN
INTRAMUSCULAR | Status: DC | PRN
  Administered 2022-11-15: 6 mg via INTRAVENOUS

## 2022-11-15 MED ORDER — MAGNESIUM HYDROXIDE 400 MG/5ML PO SUSP: 30.0000 mL | Freq: Every day | ORAL | Status: DC | PRN

## 2022-11-15 MED ORDER — DEXMEDETOMIDINE HCL IN NACL 200 MCG/50ML IV SOLN
INTRAVENOUS | Status: DC | PRN
  Administered 2022-11-15 (×2): 8 ug via INTRAVENOUS
  Administered 2022-11-15: 12 ug via INTRAVENOUS

## 2022-11-15 MED ORDER — DROPERIDOL 2.5 MG/ML IJ SOLN
INTRAMUSCULAR | Status: AC
  Filled 2022-11-15: qty 2

## 2022-11-15 MED ORDER — BUPRENORPHINE HCL 8 MG SL SUBL
8.0000 mg | SUBLINGUAL_TABLET | SUBLINGUAL | Status: AC
  Administered 2022-11-15: 8 mg via SUBLINGUAL
  Filled 2022-11-15: qty 1

## 2022-11-15 MED ORDER — KETOROLAC TROMETHAMINE 30 MG/ML IJ SOLN
15.0000 mg | Freq: Once | INTRAMUSCULAR | Status: AC
Start: 2022-11-15 — End: 2022-11-15
  Administered 2022-11-15: 15 mg via INTRAVENOUS
  Filled 2022-11-15: qty 1

## 2022-11-15 MED ORDER — ENOXAPARIN SODIUM 40 MG/0.4ML IJ SOSY
40.0000 mg | PREFILLED_SYRINGE | INTRAMUSCULAR | Status: DC
  Administered 2022-11-16: 40 mg via SUBCUTANEOUS
  Filled 2022-11-15: qty 0.4

## 2022-11-15 MED ORDER — PROPOFOL 10 MG/ML IV BOLUS
INTRAVENOUS | Status: AC
Start: 2022-11-15 — End: ?
  Filled 2022-11-15: qty 40

## 2022-11-15 MED ORDER — LACTATED RINGERS IV SOLN: INTRAVENOUS | Status: DC

## 2022-11-15 MED ORDER — BUPRENORPHINE HCL-NALOXONE HCL 8-2 MG SL SUBL
1.0000 | SUBLINGUAL_TABLET | Freq: Once | SUBLINGUAL | Status: AC
  Administered 2022-11-15: 1 via SUBLINGUAL
  Filled 2022-11-15: qty 1

## 2022-11-15 MED ORDER — POLYETHYLENE GLYCOL 3350 17 G PO PACK: 17.0000 g | PACK | Freq: Every day | ORAL | Status: DC | PRN

## 2022-11-15 MED ORDER — OXYCODONE HCL 5 MG/5ML PO SOLN: 5.0000 mg | Freq: Once | ORAL | Status: DC | PRN

## 2022-11-15 MED ORDER — METOCLOPRAMIDE HCL 10 MG PO TABS
5.0000 mg | ORAL_TABLET | Freq: Three times a day (TID) | ORAL | Status: DC | PRN
  Filled 2022-11-15: qty 1

## 2022-11-15 MED ORDER — DROPERIDOL 2.5 MG/ML IJ SOLN
0.6250 mg | Freq: Once | INTRAMUSCULAR | Status: AC
  Administered 2022-11-15: 0.625 mg via INTRAVENOUS

## 2022-11-15 MED ORDER — SODIUM CHLORIDE 0.9 % IV BOLUS
250.0000 mL | Freq: Once | INTRAVENOUS | Status: AC
  Administered 2022-11-15: 250 mL via INTRAVENOUS

## 2022-11-15 MED ORDER — BUPRENORPHINE HCL-NALOXONE HCL 8-2 MG SL SUBL
1.0000 | SUBLINGUAL_TABLET | SUBLINGUAL | Status: AC
  Administered 2022-11-15: 1 via SUBLINGUAL
  Filled 2022-11-15: qty 1

## 2022-11-15 MED ORDER — SODIUM CHLORIDE 0.9% FLUSH
10.0000 mL | Freq: Two times a day (BID) | INTRAVENOUS | Status: DC
  Administered 2022-11-15 – 2022-11-16 (×3): 10 mL via INTRAVENOUS

## 2022-11-15 MED ORDER — DIPHENHYDRAMINE HCL 12.5 MG/5ML PO ELIX
12.5000 mg | ORAL_SOLUTION | ORAL | Status: DC | PRN
  Administered 2022-11-15 – 2022-11-16 (×2): 12.5 mg via ORAL
  Filled 2022-11-15 (×3): qty 10

## 2022-11-15 MED ORDER — LACTATED RINGERS IV BOLUS (SEPSIS)
1000.0000 mL | Freq: Once | INTRAVENOUS | Status: AC
  Administered 2022-11-15: 1000 mL via INTRAVENOUS

## 2022-11-15 MED ORDER — MIDAZOLAM HCL 2 MG/2ML IJ SOLN
INTRAMUSCULAR | Status: AC
  Filled 2022-11-15: qty 2

## 2022-11-15 MED ORDER — METHADONE HCL 10 MG PO TABS
30.0000 mg | ORAL_TABLET | ORAL | Status: AC
  Administered 2022-11-15: 30 mg via ORAL
  Filled 2022-11-15: qty 3

## 2022-11-15 MED ORDER — FENTANYL CITRATE PF 50 MCG/ML IJ SOSY
50.0000 ug | PREFILLED_SYRINGE | INTRAMUSCULAR | Status: AC
Start: 2022-11-15 — End: 2022-11-15
  Administered 2022-11-15: 50 ug via INTRAVENOUS

## 2022-11-15 MED ORDER — ONDANSETRON HCL 4 MG/2ML IJ SOLN
4.0000 mg | Freq: Four times a day (QID) | INTRAMUSCULAR | Status: DC | PRN
  Administered 2022-11-15 – 2022-11-16 (×2): 4 mg via INTRAVENOUS
  Filled 2022-11-15 (×2): qty 2

## 2022-11-15 MED ORDER — DEXAMETHASONE SODIUM PHOSPHATE 10 MG/ML IJ SOLN
INTRAMUSCULAR | Status: AC
  Filled 2022-11-15: qty 1

## 2022-11-15 MED ORDER — KETOROLAC TROMETHAMINE 30 MG/ML IJ SOLN
15.0000 mg | Freq: Once | INTRAMUSCULAR | Status: AC
  Administered 2022-11-15: 15 mg via INTRAVENOUS
  Filled 2022-11-15: qty 1

## 2022-11-15 MED ORDER — ROCURONIUM BROMIDE 100 MG/10ML IV SOLN
INTRAVENOUS | Status: DC | PRN
  Administered 2022-11-15: 20 mg via INTRAVENOUS

## 2022-11-15 MED ORDER — FLEET ENEMA RE ENEM: 1.0000 | ENEMA | Freq: Once | RECTAL | Status: DC | PRN

## 2022-11-15 MED ORDER — ONDANSETRON HCL 4 MG PO TABS: 4.0000 mg | ORAL_TABLET | Freq: Four times a day (QID) | ORAL | Status: DC | PRN

## 2022-11-15 MED ORDER — MIDAZOLAM HCL 2 MG/2ML IJ SOLN
INTRAMUSCULAR | Status: DC | PRN
  Administered 2022-11-15: 2 mg via INTRAVENOUS

## 2022-11-15 MED ORDER — PROPOFOL 10 MG/ML IV BOLUS
INTRAVENOUS | Status: DC | PRN
  Administered 2022-11-15: 200 mg via INTRAVENOUS

## 2022-11-15 MED ORDER — OXYCODONE HCL 5 MG PO TABS: 5.0000 mg | ORAL_TABLET | Freq: Once | ORAL | Status: DC | PRN

## 2022-11-15 MED ORDER — KETAMINE HCL 50 MG/5ML IJ SOSY
PREFILLED_SYRINGE | INTRAMUSCULAR | Status: DC | PRN
  Administered 2022-11-15: 30 mg via INTRAVENOUS
  Administered 2022-11-15: 20 mg via INTRAVENOUS

## 2022-11-15 MED ORDER — DOCUSATE SODIUM 100 MG PO CAPS: 100.0000 mg | ORAL_CAPSULE | Freq: Two times a day (BID) | ORAL | Status: DC

## 2022-11-15 MED ORDER — DIAZEPAM 5 MG/ML IJ SOLN
10.0000 mg | Freq: Once | INTRAMUSCULAR | Status: AC
  Administered 2022-11-15: 10 mg via INTRAVENOUS
  Filled 2022-11-15: qty 2

## 2022-11-15 MED ORDER — SODIUM CHLORIDE 0.9% FLUSH
3.0000 mL | Freq: Two times a day (BID) | INTRAVENOUS | Status: DC
  Administered 2022-11-15 – 2022-11-16 (×3): 3 mL via INTRAVENOUS

## 2022-11-15 MED ORDER — LIDOCAINE HCL (PF) 2 % IJ SOLN
INTRAMUSCULAR | Status: AC
Start: 2022-11-15 — End: ?
  Filled 2022-11-15: qty 5

## 2022-11-15 MED ORDER — SUGAMMADEX SODIUM 200 MG/2ML IV SOLN
INTRAVENOUS | Status: DC | PRN
  Administered 2022-11-15: 200 mg via INTRAVENOUS

## 2022-11-15 MED ORDER — ENOXAPARIN SODIUM 40 MG/0.4ML IJ SOSY: 40.0000 mg | PREFILLED_SYRINGE | INTRAMUSCULAR | Status: DC

## 2022-11-15 MED ORDER — LACTATED RINGERS IV BOLUS (SEPSIS): 250.0000 mL | Freq: Once | INTRAVENOUS | Status: DC

## 2022-11-15 SURGICAL SUPPLY — 41 items
APL PRP STRL LF DISP 70% ISPRP (MISCELLANEOUS) ×1
BLADE SURG SZ10 CARB STEEL (BLADE) ×1 IMPLANT
BNDG CMPR 5X4 KNIT ELC UNQ LF (GAUZE/BANDAGES/DRESSINGS) ×1
BNDG ELASTIC 4INX 5YD STR LF (GAUZE/BANDAGES/DRESSINGS) ×1 IMPLANT
BNDG ESMARCH 4 X 12 STRL LF (GAUZE/BANDAGES/DRESSINGS) ×1
BNDG ESMARCH 4X12 STRL LF (GAUZE/BANDAGES/DRESSINGS) ×1 IMPLANT
CHLORAPREP W/TINT 26 (MISCELLANEOUS) ×1 IMPLANT
CUFF TOURN SGL QUICK 18X4 (TOURNIQUET CUFF) IMPLANT
CUFF TOURN SGL QUICK 24 (TOURNIQUET CUFF)
CUFF TRNQT CYL 24X4X16.5-23 (TOURNIQUET CUFF) IMPLANT
DRAIN PENROSE 0.625X18 (DRAIN) IMPLANT
ELECT REM PT RETURN 9FT ADLT (ELECTROSURGICAL) ×1
ELECTRODE REM PT RTRN 9FT ADLT (ELECTROSURGICAL) ×1 IMPLANT
GAUZE SPONGE 4X4 12PLY STRL (GAUZE/BANDAGES/DRESSINGS) IMPLANT
GAUZE XEROFORM 1X8 LF (GAUZE/BANDAGES/DRESSINGS) IMPLANT
GLOVE BIO SURGEON STRL SZ8 (GLOVE) ×1 IMPLANT
GLOVE INDICATOR 8.0 STRL GRN (GLOVE) ×1 IMPLANT
GLOVE SURG ORTHO 8.5 STRL (GLOVE) ×1 IMPLANT
GOWN STRL REUS W/ TWL LRG LVL3 (GOWN DISPOSABLE) ×1 IMPLANT
GOWN STRL REUS W/ TWL XL LVL3 (GOWN DISPOSABLE) ×1 IMPLANT
GOWN STRL REUS W/TWL LRG LVL3 (GOWN DISPOSABLE) ×1
GOWN STRL REUS W/TWL XL LVL3 (GOWN DISPOSABLE) ×1
KIT TURNOVER KIT A (KITS) ×1 IMPLANT
LABEL OR SOLS (LABEL) ×1 IMPLANT
MANIFOLD NEPTUNE II (INSTRUMENTS) ×1 IMPLANT
NDL SAFETY ECLIPSE 18X1.5 (NEEDLE) ×1 IMPLANT
NS IRRIG 1000ML POUR BTL (IV SOLUTION) ×1 IMPLANT
PACK EXTREMITY ARMC (MISCELLANEOUS) ×1 IMPLANT
PAD CAST 4YDX4 CTTN HI CHSV (CAST SUPPLIES) ×1 IMPLANT
PADDING CAST BLEND 4X4 STRL (MISCELLANEOUS) ×1 IMPLANT
PADDING CAST COTTON 4X4 STRL (CAST SUPPLIES) ×2
SPLINT CAST 1 STEP 3X12 (MISCELLANEOUS) ×1 IMPLANT
SPONGE T-LAP 18X18 ~~LOC~~+RFID (SPONGE) ×1 IMPLANT
STAPLER SKIN PROX 35W (STAPLE) ×1 IMPLANT
STOCKINETTE BIAS CUT 4 980044 (GAUZE/BANDAGES/DRESSINGS) ×1 IMPLANT
STOCKINETTE IMPERVIOUS 9X36 MD (GAUZE/BANDAGES/DRESSINGS) ×1 IMPLANT
SUT PROLENE 0 CT 2 (SUTURE) IMPLANT
SUT PROLENE 4 0 PS 2 18 (SUTURE) ×2 IMPLANT
SYR 10ML LL (SYRINGE) ×1 IMPLANT
TRAP FLUID SMOKE EVACUATOR (MISCELLANEOUS) ×1 IMPLANT
WATER STERILE IRR 500ML POUR (IV SOLUTION) ×1 IMPLANT

## 2022-11-15 NOTE — Consult Note (Signed)
ORTHOPAEDIC CONSULTATION  REQUESTING PHYSICIAN: Nolberto Hanlon, MD  Chief Complaint:   Left forearm pain and swelling.  History of Present Illness: Jeffery Knight. is a 32 y.o. male with a history of IV drug abuse who has had a 1 month history of slow progressive feelings of fatigue, shortness of breath, and "heart flutter" sensations.  In addition, over the last 3 days, he has noted vomiting, diarrhea, and abdominal discomfort with cramping as well as sweating and shivering.  He last injected himself into the left forearm about 5 days ago.  About 3 days ago, he developed some swelling and pain in the forearm which has progressed, prompting him to present to the emergency room.  Workup in the emergency room has demonstrated the patient to be in sepsis.  An ultrasound of the left forearm has confirmed the presence of an abscess in the subcutaneous tissues of his left mid forearm, prompting orthopedic consultation.  No past medical history on file. Past Surgical History:  Procedure Laterality Date   left hand surgery      Social History   Socioeconomic History   Marital status: Single    Spouse name: Not on file   Number of children: Not on file   Years of education: Not on file   Highest education level: Not on file  Occupational History   Not on file  Tobacco Use   Smoking status: Every Day    Current packs/day: 0.50    Types: Cigarettes   Smokeless tobacco: Never  Vaping Use   Vaping status: Never Used  Substance and Sexual Activity   Alcohol use: No   Drug use: Yes    Types: Marijuana   Sexual activity: Not on file  Other Topics Concern   Not on file  Social History Narrative   Not on file   Social Determinants of Health   Financial Resource Strain: Not on file  Food Insecurity: Not on file  Transportation Needs: Not on file  Physical Activity: Not on file  Stress: Not on file  Social Connections: Not  on file   No family history on file. Allergies  Allergen Reactions   Tramadol Itching   Prior to Admission medications   Medication Sig Start Date End Date Taking? Authorizing Provider  HYDROcodone-acetaminophen (NORCO/VICODIN) 5-325 MG tablet Take 1 tablet by mouth every 6 (six) hours as needed for moderate pain. Patient not taking: Reported on 11/15/2022 02/10/19   Tommi Rumps, PA-C  naproxen (NAPROSYN) 500 MG tablet Take 1 tablet (500 mg total) by mouth 2 (two) times daily with a meal. Patient not taking: Reported on 11/15/2022 02/10/19   Tommi Rumps, PA-C   [place] LT UPPER EXTREM LTD SOFT TISSUE NON VASCULAR  Result Date: 11/15/2022 CLINICAL DATA:  Abscess, IV drug abuse EXAM: ULTRASOUND LEFT UPPER EXTREMITY LIMITED TECHNIQUE: Ultrasound examination of the upper extremity soft tissues was performed in the area of clinical concern. COMPARISON:  None Available. FINDINGS: In the area of concern, a complex subcutaneous hypoechoic lesion along the superficial fascia margin measures 2.7 by 1.1 by 2.0 cm (volume = 3.1 cm^3). A small abscess is likely. IMPRESSION: 1. Small abscess in the area of concern, measuring 2.7 by 1.1 by 2.0 cm. Electronically Signed   By: Gaylyn Rong M.D.   On: 11/15/2022 10:04    Positive ROS: All other systems have been reviewed and were otherwise negative with the exception of those mentioned in the HPI and as above.  Physical Exam: General:  Alert, no acute distress Psychiatric:  Patient is competent for consent with normal mood and affect   Cardiovascular:  No pedal edema Respiratory:  No wheezing, non-labored breathing GI:  Abdomen is soft and non-tender Skin:  No lesions in the area of chief complaint Neurologic:  Sensation intact distally Lymphatic:  No axillary or cervical lymphadenopathy  Orthopedic Exam:  Orthopedic examination is limited to the left forearm and hand.  Skin inspection of the left forearm is notable for moderate focal  swelling and tenderness in the volar mid forearm region approximately 3-4 inches below the antecubital fossa.  There is no overlying erythema and no other skin abnormalities are identified the remainder of the volar and dorsal forearm compartments are soft.  He is able to active flex and extend his wrist without any pain, and is able to flex and extend all digits without any pain.  He is grossly neurovascularly intact to the left forearm and hand.  X-rays:  A recent diagnostic ultrasound of the left forearm has been performed with the findings as described above.  Assessment: Subcutaneous left forearm abscess status post IV drug injection.  Plan: The treatment options, including both surgical and nonsurgical choices, have been discussed in detail with the patient and his/her family.  The patient agrees to proceed with a formal irrigation debridement of the left forearm abscess.  The risks (including bleeding, resistant or recurrent infection, nerve and/or blood vessel injury, persistent or recurrent pain, residual forearm weakness, need for further surgery, blood clots, strokes, heart attacks or arrhythmias, pneumonia, etc.) and benefits of the surgical procedure were discussed.  The patient states his understanding and agrees to proceed. A formal written consent will be obtained by the nursing staff.  Thank you for asking me to participate in the care of this most unfortunate young man.  I will be happy to follow him with you.   Maryagnes Amos, MD  Beeper #:  [phone]  11/15/2022 1:33 PM

## 2022-11-15 NOTE — Progress Notes (Signed)
PHARMACY -  BRIEF ANTIBIOTIC NOTE   Pharmacy has received consult(s) for Vancomycin from an ED provider.  The patient's profile has been reviewed for ht/wt/allergies/indication/available labs.    One time order(s) placed for Vancomycin 1500 mg IV X 1  Further antibiotics/pharmacy consults should be ordered by admitting physician if indicated.                       Thank you, Kairee Kozma D 11/15/2022  6:12 AM

## 2022-11-15 NOTE — ED Triage Notes (Signed)
Pt reports pain to left forearm where there is a swollen area. Pt reports it has been swollen for a couple weeks after injecting unknown drugs. Pt complains of anxiety, emesis, loss of appetite over the last few days. Pt reports last used opioids yesterday.

## 2022-11-15 NOTE — Assessment & Plan Note (Signed)
Patient reports complaints of palpitations as well as fatigue that started approximately a month ago that were severe enough to interfere with his activities of work.  At this time I will check a thyroid Skains cascade and maintain the patient on telemetry.  Will also check an echo

## 2022-11-15 NOTE — Progress Notes (Signed)
Pharmacy Antibiotic Note  Jeffery Knight. is a 31 y.o. male w/ PMH of IVDA admitted on 11/15/2022 with cellulitis.  Pharmacy has been consulted for vancomycin dosing.  Plan: start vancomycin 1500 mg IV x 1 then 750 mg IV every 8 hours Goal AUC 400-550. Expected AUC: 425 SCr used: 0.80 mg/dL (rounded up) Ke 6.606 h-1, T1/2 6.3h  Height: 5\' 10"  (177.8 cm) Weight: 66.7 kg (147 lb) IBW/kg (Calculated) : 73  Temp (24hrs), Avg:98.8 F (37.1 C), Min:97.7 F (36.5 C), Max:99.7 F (37.6 C)  Recent Labs  Lab 11/15/22 0249 11/15/22 0455 11/15/22 0838  WBC 17.2*  --   --   CREATININE 0.78  --   --   LATICACIDVEN  --  3.0* 2.9*    Estimated Creatinine Clearance: 127.4 mL/min (by C-G formula based on SCr of 0.78 mg/dL).    Allergies  Allergen Reactions   Tramadol Itching    Antimicrobials this admission: 10/26 ceftriaxone x 1  10/26 vancomycin >>   Microbiology results: 10/26 BCx: pending  Thank you for allowing pharmacy to be a part of this patient's care.  [name] 11/15/2022 12:04 PM

## 2022-11-15 NOTE — ED Notes (Signed)
Echo tech at patient bedside

## 2022-11-15 NOTE — Assessment & Plan Note (Addendum)
On physical exam patient has more often in duration than a swelling that is tender on the left forearm as described in physical exam.  Therefore initially I had only intermediate concern for an abscess, there is no pointing evident no marked erythema.  But [place] is showing a deep abscess.  Blood cultures have already been sent and patient has been started on vancomycin and ceftriaxone, continue with vancomycin at this time.  Distal function is intact

## 2022-11-15 NOTE — Assessment & Plan Note (Signed)
Patient has been using fentanyl and heroin for approximately 7 years, patient reports that he stopped using above agents approximately 2 days ago because he was concerned that there was some contamination in the supply given his symptoms of palpitations.  Patient is currently having active withdrawal manifested by diarrhea vomiting piloerection pupillary dilation as well as sweating.  Patient has received approximately 24 mg of buprenorphine in various formulations since approximately before 8 AM today.  In spite of this patient is at least having moderate withdrawal.  This would be Antis expected given given limited efficacy of buprenorphine against withdrawal from fentanyl.  Patient's EKG is currently showing QTc less than 500 ms.  At this time I will treat the patient with 30 mg of methadone.  And proceed accordingly.  Continue with monitoring patient withdrawal score every 4 hours.

## 2022-11-15 NOTE — ED Notes (Signed)
ED TO INPATIENT HANDOFF REPORT  ED Nurse Name and Phone #: Osborne Casco, RN   S Name/Age/Gender Jeffery Knight. 32 y.o. male Room/Bed: ED13A/ED13A  Code Status   Code Status: Full Code  Home/SNF/Other Home Patient oriented to: self, place, time, and situation Is this baseline? Yes   Triage Complete: Triage complete  Chief Complaint Enteritis [K52.9]  Triage Note Pt reports pain to left forearm where there is a swollen area. Pt reports it has been swollen for a couple weeks after injecting unknown drugs. Pt complains of anxiety, emesis, loss of appetite over the last few days. Pt reports last used opioids yesterday.    Allergies Allergies  Allergen Reactions   Tramadol Itching    Level of Care/Admitting Diagnosis ED Disposition     ED Disposition  Admit   Condition  --   Comment  Hospital Area: [facility] [[id number]]  Level of Care: Progressive [102]  Admit to Progressive based on following criteria: GI, ENDOCRINE disease patients with GI bleeding, acute liver failure or pancreatitis, stable with diabetic ketoacidosis or thyrotoxicosis (hypothyroid) state.  Covid Evaluation: Asymptomatic - no recent exposure (last 10 days) testing not required  Diagnosis: Enteritis [[id number]]  Admitting Physician: Nolberto Hanlon [[id number]]  Attending Physician: Nolberto Hanlon [[id number]]  Certification:: I certify this patient will need inpatient services for at least 2 midnights  Expected Medical Readiness: 11/18/2022          B Medical/Surgery History No past medical history on file. Past Surgical History:  Procedure Laterality Date   left hand surgery        A IV Location/Drains/Wounds Patient Lines/Drains/Airways Status     Active Line/Drains/Airways     Name Placement date Placement time Site Days   Peripheral IV 11/15/22 20 G Right Antecubital 11/15/22  0515  Antecubital  less than 1   Peripheral IV 11/15/22 20 G Left Antecubital 11/15/22  0629   Antecubital  less than 1            Intake/Output Last 24 hours  Intake/Output Summary (Last 24 hours) at 11/15/2022 1416 Last data filed at 11/15/2022 1331 Gross per 24 hour  Intake 3449.16 ml  Output --  Net 3449.16 ml    Labs/Imaging Results for orders placed or performed during the hospital encounter of 11/15/22 (from the past 48 hour(s))  Lipase, blood     Status: None   Collection Time: 11/15/22  2:49 AM  Result Value Ref Range   Lipase 24 11 - 51 U/L    Comment: Performed at [facility], [address] [place], [place] [address]  Comprehensive metabolic panel     Status: Abnormal   Collection Time: 11/15/22  2:49 AM  Result Value Ref Range   Sodium 137 135 - 145 mmol/L   Potassium 3.4 (L) 3.5 - 5.1 mmol/L   Chloride 101 98 - 111 mmol/L   CO2 25 22 - 32 mmol/L   Glucose, Bld 126 (H) 70 - 99 mg/dL    Comment: Glucose reference range applies only to samples taken after fasting for at least 8 hours.   BUN 8 6 - 20 mg/dL   Creatinine, Ser 1.32 0.61 - 1.24 mg/dL   Calcium 9.3 8.9 - 44.0 mg/dL   Total Protein 8.4 (H) 6.5 - 8.1 g/dL   Albumin 4.1 3.5 - 5.0 g/dL   AST 26 15 - 41 U/L   ALT 27 0 - 44 U/L   Alkaline Phosphatase 76 38 - 126  U/L   Total Bilirubin 1.0 0.3 - 1.2 mg/dL   GFR, Estimated >96 >29 mL/min    Comment: (NOTE) Calculated using the CKD-EPI Creatinine Equation (2021)    Anion gap 11 5 - 15    Comment: Performed at [facility], [address] [place], [place] [address]  CBC     Status: Abnormal   Collection Time: 11/15/22  2:49 AM  Result Value Ref Range   WBC 17.2 (H) 4.0 - 10.5 K/uL   RBC 5.64 4.22 - 5.81 MIL/uL   Hemoglobin 16.1 13.0 - 17.0 g/dL   HCT 32.4 40.1 - 02.7 %   MCV 83.2 80.0 - 100.0 fL   MCH 28.5 26.0 - 34.0 pg   MCHC 34.3 30.0 - 36.0 g/dL   RDW 25.3 66.4 - 40.3 %   Platelets 362 150 - 400 K/uL   nRBC 0.0 0.0 - 0.2 %    Comment: Performed at [facility], [address] [place], [place]  [address]  Resp panel by RT-PCR (RSV, Flu A&B, Covid) Anterior Nasal Swab     Status: None   Collection Time: 11/15/22  2:49 AM   Specimen: Anterior Nasal Swab  Result Value Ref Range   SARS Coronavirus 2 by RT PCR NEGATIVE NEGATIVE    Comment: (NOTE) SARS-CoV-2 target nucleic acids are NOT DETECTED.  The SARS-CoV-2 RNA is generally detectable in upper respiratory specimens during the acute phase of infection. The lowest concentration of SARS-CoV-2 viral copies this assay can detect is 138 copies/mL. A negative result does not preclude SARS-Cov-2 infection and should not be used as the sole basis for treatment or other patient management decisions. A negative result may occur with  improper specimen collection/handling, submission of specimen other than nasopharyngeal swab, presence of viral mutation(s) within the areas targeted by this assay, and inadequate number of viral copies(<138 copies/mL). A negative result must be combined with clinical observations, patient history, and epidemiological information. The expected result is Negative.  Fact Sheet for Patients:  BloggerCourse.com  Fact Sheet for Healthcare Providers:  SeriousBroker.it  This test is no t yet approved or cleared by the [place] FDA and  has been authorized for detection and/or diagnosis of SARS-CoV-2 by FDA under an Emergency Use Authorization (EUA). This EUA will remain  in effect (meaning this test can be used) for the duration of the COVID-19 declaration under Section 564(b)(1) of the Act, 21 U.S.C.section 360bbb-3(b)(1), unless the authorization is terminated  or revoked sooner.       Influenza A by PCR NEGATIVE NEGATIVE   Influenza B by PCR NEGATIVE NEGATIVE    Comment: (NOTE) The Xpert Xpress SARS-CoV-2/FLU/RSV plus assay is intended as an aid in the diagnosis of influenza from Nasopharyngeal swab specimens and should not be used as a sole basis for  treatment. Nasal washings and aspirates are unacceptable for Xpert Xpress SARS-CoV-2/FLU/RSV testing.  Fact Sheet for Patients: BloggerCourse.com  Fact Sheet for Healthcare Providers: SeriousBroker.it  This test is not yet approved or cleared by the [place] FDA and has been authorized for detection and/or diagnosis of SARS-CoV-2 by FDA under an Emergency Use Authorization (EUA). This EUA will remain in effect (meaning this test can be used) for the duration of the COVID-19 declaration under Section 564(b)(1) of the Act, 21 U.S.C. section 360bbb-3(b)(1), unless the authorization is terminated or revoked.     Resp Syncytial Virus by PCR NEGATIVE NEGATIVE    Comment: (NOTE) Fact Sheet for Patients: BloggerCourse.com  Fact  Sheet for Healthcare Providers: SeriousBroker.it  This test is not yet approved or cleared by the [place] and has been authorized for detection and/or diagnosis of SARS-CoV-2 by FDA under an Emergency Use Authorization (EUA). This EUA will remain in effect (meaning this test can be used) for the duration of the COVID-19 declaration under Section 564(b)(1) of the Act, 21 U.S.C. section 360bbb-3(b)(1), unless the authorization is terminated or revoked.  Performed at [facility], [address] [place], [place] [address]   Lactic acid, plasma     Status: Abnormal   Collection Time: 11/15/22  4:55 AM  Result Value Ref Range   Lactic Acid, Venous 3.0 (HH) 0.5 - 1.9 mmol/L    Comment: CRITICAL RESULT CALLED TO, READ BACK BY AND VERIFIED WITH CATHY CHERNESKY 11/15/2022 AT 0604 SRR Performed at [facility], [address] [place], [place] [address]   Blood Culture (routine x 2)     Status: None (Preliminary result)   Collection Time: 11/15/22  4:55 AM   Specimen: BLOOD  Result Value Ref Range   Specimen Description BLOOD  RIGHT ANTECUBITAL    Special Requests      BOTTLES DRAWN AEROBIC AND ANAEROBIC Blood Culture adequate volume   Culture      NO GROWTH <12 HOURS Performed at [facility], [address] [place], [place] [address]    Report Status PENDING   Blood Culture (routine x 2)     Status: None (Preliminary result)   Collection Time: 11/15/22  4:55 AM   Specimen: BLOOD  Result Value Ref Range   Specimen Description BLOOD LEFT ANTECUBITAL    Special Requests      BOTTLES DRAWN AEROBIC AND ANAEROBIC Blood Culture adequate volume   Culture      NO GROWTH <12 HOURS Performed at [facility], [address] [place], [place] [address]    Report Status PENDING   Urinalysis, Routine w reflex microscopic -Urine, Clean Catch     Status: Abnormal   Collection Time: 11/15/22  6:00 AM  Result Value Ref Range   Color, Urine YELLOW (A) YELLOW   APPearance CLEAR (A) CLEAR   Specific Gravity, Urine 1.020 1.005 - 1.030   pH 6.0 5.0 - 8.0   Glucose, UA NEGATIVE NEGATIVE mg/dL   Hgb urine dipstick NEGATIVE NEGATIVE   Bilirubin Urine NEGATIVE NEGATIVE   Ketones, ur 80 (A) NEGATIVE mg/dL   Protein, ur NEGATIVE NEGATIVE mg/dL   Nitrite NEGATIVE NEGATIVE   Leukocytes,Ua NEGATIVE NEGATIVE    Comment: Performed at [facility], [address], [place], [place] [address]  Urine Drug Screen, Qualitative ([facility] only)     Status: Abnormal   Collection Time: 11/15/22  6:50 AM  Result Value Ref Range   Tricyclic, Ur Screen NONE DETECTED NONE DETECTED   Amphetamines, Ur Screen NONE DETECTED NONE DETECTED   MDMA (Ecstasy)Ur Screen NONE DETECTED NONE DETECTED   Cocaine Metabolite,Ur [place] NONE DETECTED NONE DETECTED   Opiate, Ur Screen POSITIVE (A) NONE DETECTED   Phencyclidine (PCP) Ur S NONE DETECTED NONE DETECTED   Cannabinoid 50 Ng, Ur  POSITIVE (A) NONE DETECTED   Barbiturates, Ur Screen NONE DETECTED NONE DETECTED   Benzodiazepine, Ur Scrn NONE DETECTED NONE DETECTED   Methadone  Scn, Ur NONE DETECTED NONE DETECTED    Comment: (NOTE) Tricyclics + metabolites, urine    Cutoff 1000 ng/mL Amphetamines + metabolites, urine  Cutoff 1000 ng/mL MDMA (Ecstasy), urine  Cutoff 500 ng/mL Cocaine Metabolite, urine          Cutoff 300 ng/mL Opiate + metabolites, urine        Cutoff 300 ng/mL Phencyclidine (PCP), urine         Cutoff 25 ng/mL Cannabinoid, urine                 Cutoff 50 ng/mL Barbiturates + metabolites, urine  Cutoff 200 ng/mL Benzodiazepine, urine              Cutoff 200 ng/mL Methadone, urine                   Cutoff 300 ng/mL  The urine drug screen provides only a preliminary, unconfirmed analytical test result and should not be used for non-medical purposes. Clinical consideration and professional judgment should be applied to any positive drug screen result due to possible interfering substances. A more specific alternate chemical method must be used in order to obtain a confirmed analytical result. Gas chromatography / mass spectrometry (GC/MS) is the preferred confirm atory method. Performed at [facility], [address] [place], [place] [address]   Lactic acid, plasma     Status: Abnormal   Collection Time: 11/15/22  8:38 AM  Result Value Ref Range   Lactic Acid, Venous 2.9 (HH) 0.5 - 1.9 mmol/L    Comment: CRITICAL VALUE NOTED. VALUE IS CONSISTENT WITH PREVIOUSLY REPORTED/CALLED VALUE MJU Performed at [facility], [address] [place], [place] [address]   T4, free     Status: Abnormal   Collection Time: 11/15/22 11:57 AM  Result Value Ref Range   Free T4 1.34 (H) 0.61 - 1.12 ng/dL    Comment: (NOTE) Biotin ingestion may interfere with free T4 tests. If the results are inconsistent with the TSH level, previous test results, or the clinical presentation, then consider biotin interference. If needed, order repeat testing after stopping biotin. Performed at [facility], [address]  [place], [place] [address]   Troponin I (High Sensitivity)     Status: Abnormal   Collection Time: 11/15/22 11:57 AM  Result Value Ref Range   Troponin I (High Sensitivity) 91 (H) <18 ng/L    Comment: (NOTE) Elevated high sensitivity troponin I (hsTnI) values and significant  changes across serial measurements may suggest ACS but many other  chronic and acute conditions are known to elevate hsTnI results.  Refer to the "Links" section for chest pain algorithms and additional  guidance. Performed at [facility], [address] [place], [place] [address]    [place] LT UPPER EXTREM LTD SOFT TISSUE NON VASCULAR  Result Date: 11/15/2022 CLINICAL DATA:  Abscess, IV drug abuse EXAM: ULTRASOUND LEFT UPPER EXTREMITY LIMITED TECHNIQUE: Ultrasound examination of the upper extremity soft tissues was performed in the area of clinical concern. COMPARISON:  None Available. FINDINGS: In the area of concern, a complex subcutaneous hypoechoic lesion along the superficial fascia margin measures 2.7 by 1.1 by 2.0 cm (volume = 3.1 cm^3). A small abscess is likely. IMPRESSION: 1. Small abscess in the area of concern, measuring 2.7 by 1.1 by 2.0 cm. Electronically Signed   By: Gaylyn Rong M.D.   On: 11/15/2022 10:04    Pending Labs Unresulted Labs (From admission, onward)     Start     Ordered   11/22/22 0500  Creatinine, serum  (enoxaparin (LOVENOX)    CrCl >/= 30 ml/min)  Weekly,   STAT     Comments:  while on enoxaparin therapy    11/15/22 1259   11/16/22 0500  Basic metabolic panel  Tomorrow morning,   STAT        11/15/22 1122   11/16/22 0500  HIV Antibody (routine testing w rflx)  (HIV Antibody (Routine testing w reflex) panel)  Tomorrow morning,   URGENT        11/15/22 1217   11/16/22 0500  HCV Ab w Reflex to Quant PCR  Tomorrow morning,   URGENT        11/15/22 1217   11/16/22 0500  RPR  Tomorrow morning,   URGENT        11/15/22 1217   11/16/22 0500  APTT  Tomorrow morning,   STAT         11/15/22 1259   11/16/22 0500  Protime-INR  Tomorrow morning,   STAT        11/15/22 1259   11/16/22 0500  Basic metabolic panel  Tomorrow morning,   STAT        11/15/22 1259   11/16/22 0500  CBC  Tomorrow morning,   STAT        11/15/22 1259   11/15/22 1248  Gastrointestinal Panel by PCR , Stool  (Gastrointestinal Panel by PCR, Stool                                                                                                                                                     **Does Not include CLOSTRIDIUM DIFFICILE testing. **If CDIFF testing is needed, place order from the "C Difficile Testing" order set.**)  Once,   URGENT        11/15/22 1247   11/15/22 1247  C Difficile Quick Screen w PCR reflex  (C Difficile quick screen w PCR reflex panel )  Once, for 24 hours,   URGENT       References:    CDiff Information Tool   11/15/22 1247   11/15/22 1145  Thyroid Panel With TSH  ONCE - URGENT,   URGENT        11/15/22 1144            Vitals/Pain Today's Vitals   11/15/22 1130 11/15/22 1300 11/15/22 1336 11/15/22 1400  BP: 129/72 128/79  139/87  Pulse: (!) 110 (!) 111  (!) 107  Resp: (!) 23 (!) 21  (!) 28  Temp:      TempSrc:      SpO2: 100% 100%  100%  Weight:      Height:      PainSc:   8      Isolation Precautions Enteric precautions (UV disinfection)  Medications Medications  sodium chloride flush (NS) 0.9 % injection 10 mL (10 mLs Intravenous Given 11/15/22 0915)  0.9 %  sodium chloride infusion ( Intravenous New Bag/Given 11/15/22 1331)  acetaminophen (  TYLENOL) tablet 650 mg (650 mg Oral Given 11/15/22 1232)  vancomycin (VANCOREADY) IVPB 750 mg/150 mL (has no administration in time range)  potassium chloride 10 mEq in 100 mL IVPB (10 mEq Intravenous New Bag/Given 11/15/22 1409)  enoxaparin (LOVENOX) injection 40 mg (has no administration in time range)  polyethylene glycol (MIRALAX / GLYCOLAX) packet 17 g (has no administration in time range)  sodium chloride  flush (NS) 0.9 % injection 3 mL (3 mLs Intravenous Given 11/15/22 1412)  buprenorphine (SUBUTEX) sublingual tablet 8 mg (8 mg Sublingual Given 11/15/22 0532)  lactated ringers bolus 1,000 mL (0 mLs Intravenous Stopped 11/15/22 0801)    And  lactated ringers bolus 1,000 mL (0 mLs Intravenous Stopped 11/15/22 0801)  cefTRIAXone (ROCEPHIN) 2 g in sodium chloride 0.9 % 100 mL IVPB (0 g Intravenous Stopped 11/15/22 0659)  ketorolac (TORADOL) 30 MG/ML injection 15 mg (15 mg Intravenous Given 11/15/22 0635)  buprenorphine-naloxone (SUBOXONE) 8-2 mg per SL tablet 1 tablet (1 tablet Sublingual Given 11/15/22 0635)  vancomycin (VANCOREADY) IVPB 1500 mg/300 mL (0 mg Intravenous Stopped 11/15/22 0834)  sodium chloride 0.9 % bolus 250 mL (0 mLs Intravenous Stopped 11/15/22 0801)  buprenorphine-naloxone (SUBOXONE) 8-2 mg per SL tablet 1 tablet (1 tablet Sublingual Given 11/15/22 0802)  ketorolac (TORADOL) 30 MG/ML injection 15 mg (15 mg Intravenous Given 11/15/22 0915)  methadone (DOLOPHINE) tablet 30 mg (30 mg Oral Given 11/15/22 1401)    Mobility walks with person assist     Focused Assessments     R Recommendations: See Admitting Provider Note  Report given to:   Additional Notes:

## 2022-11-15 NOTE — Anesthesia Postprocedure Evaluation (Signed)
Anesthesia Post Note  Patient: Jeffery Knight.  Procedure(s) Performed: IRRIGATION AND DEBRIDEMENT LEFT FOREARM (Left: Arm Lower)  Patient location during evaluation: PACU Anesthesia Type: General Level of consciousness: awake and alert Pain management: pain level controlled Vital Signs Assessment: post-procedure vital signs reviewed and stable Respiratory status: spontaneous breathing, nonlabored ventilation, respiratory function stable and patient connected to nasal cannula oxygen Cardiovascular status: blood pressure returned to baseline and stable Postop Assessment: no apparent nausea or vomiting Anesthetic complications: no   No notable events documented.   Last Vitals:  Vitals:   11/15/22 1800 11/15/22 1815  BP: (!) 147/88 (!) 144/91  Pulse: (!) 104 89  Resp: 16 (!) 21  Temp:  37.1 C  SpO2: 100% 100%    Last Pain:  Vitals:   11/15/22 1815  TempSrc:   PainSc: 0-No pain                 Louie Boston

## 2022-11-15 NOTE — Anesthesia Preprocedure Evaluation (Signed)
Anesthesia Evaluation  Patient identified by MRN, date of birth, ID band Patient awake    Reviewed: Allergy & Precautions, NPO status , Patient's Chart, lab work & pertinent test results  History of Anesthesia Complications Negative for: history of anesthetic complications  Airway Mallampati: II  TM Distance: >3 FB Neck ROM: full    Dental  (+) Chipped, Poor Dentition   Pulmonary neg pulmonary ROS   Pulmonary exam normal        Cardiovascular Normal cardiovascular exam  palpitations   Neuro/Psych negative neurological ROS  negative psych ROS   GI/Hepatic negative GI ROS,,,(+)     substance abuse  IV drug use  Endo/Other  negative endocrine ROS    Renal/GU      Musculoskeletal  (+)  narcotic dependent  Abdominal   Peds  Hematology negative hematology ROS (+)   Anesthesia Other Findings No past medical history on file.  Past Surgical History: No date: left hand surgery   BMI    Body Mass Index: 21.09 kg/m      Reproductive/Obstetrics negative OB ROS                             Anesthesia Physical Anesthesia Plan  ASA: 2  Anesthesia Plan: General LMA   Post-op Pain Management: Tylenol PO (pre-op)* and Toradol IV (intra-op)*   Induction: Intravenous  PONV Risk Score and Plan: 2 and Dexamethasone, Ondansetron, Midazolam and Treatment may vary due to age or medical condition  Airway Management Planned: LMA  Additional Equipment:   Intra-op Plan:   Post-operative Plan: Extubation in OR  Informed Consent: I have reviewed the patients History and Physical, chart, labs and discussed the procedure including the risks, benefits and alternatives for the proposed anesthesia with the patient or authorized representative who has indicated his/her understanding and acceptance.     Dental Advisory Given  Plan Discussed with: Anesthesiologist, CRNA and Surgeon  Anesthesia  Plan Comments: (Patient consented for risks of anesthesia including but not limited to:  - adverse reactions to medications - damage to eyes, teeth, lips or other oral mucosa - nerve damage due to positioning  - sore throat or hoarseness - Damage to heart, brain, nerves, lungs, other parts of body or loss of life  Patient voiced understanding and assent.)       Anesthesia Quick Evaluation

## 2022-11-15 NOTE — Op Note (Signed)
11/15/2022  5:45 PM  Patient:   Jeffery Knight.  Pre-Op Diagnosis:   Subcutaneous abscess, left volar forearm.  Post-Op Diagnosis:   Same  Procedure:   Irrigation and debridement of left volar forearm abscess.  Surgeon:   Maryagnes Amos, MD  Assistant:   None  Anesthesia:   GET  Findings:   As above.  Complications:   None  Fluids:   700 cc crystalloid  EBL:   5 cc  UOP:   None cc  TT:   22 minutes at 250 mmHg  Drains:   None  Closure:   #0 Prolene interrupted sutures  Brief Clinical Note:   The patient is a 32 year old male with a history of IV drug abuse who presented to the emergency room with a several day history of progressively worsening pain and swelling in his volar forearm region.  The patient acknowledges having injected himself there 4 to 5 days ago.  His history and examination are consistent with a volar forearm abscess confirmed by diagnostic ultrasound.  The patient presents at this time for formal irrigation and debridement of the left volar forearm abscess.  Procedure:   The patient was brought into the operating room and laid in the supine position.  After adequate general endotracheal intubation and anesthesia were obtained, the patient's left hand and upper extremity were prepped with ChloraPrep solution before being draped sterilely.  Perioperative antibiotics were continued.  While the limb was held in an elevated position, a timeout was performed to verify the appropriate surgical site.  After keeping the arm elevated for several minutes, the tourniquet was inflated to 250 mmHg.  An approximately 4 to 5 cm incision was made longitudinally centered over the volar forearm abscess.  The incision was carried down through the subcutaneous tissues.  A sclerosed/clotted subcutaneous vein was identified, transected, and cauterized.  The abscess was entered and a culture obtained.  Thick purulent material was identified and evacuated.  A curette was used to  scrape the sides of the abscess to be sure that it was cleaned out as thoroughly as possible.    The wound was thoroughly irrigated with a liter of sterile saline solution.  A Penrose drain was placed before the skin was closed using 4-0 Prolene interrupted sutures a sterile bulky dressing was applied to the wound before the patient was placed into a volar splint maintaining the wrist in neutral position.  The patient was then awakened, extubated, and returned to the recovery room in satisfactory condition after tolerating the procedure well.  He did vomit after extubation.

## 2022-11-15 NOTE — Assessment & Plan Note (Signed)
This is likely part and parcel of opiate withdrawal in this patient.  Antibiotics will be continued for abscess, see separate problem.  I will check stool for C. difficile and pathogen panel

## 2022-11-15 NOTE — ED Provider Notes (Signed)
Emergency department handoff note  Care of this patient was signed out to me at the end of the previous provider shift.  All pertinent patient information was conveyed and all questions were answered.  Patient pending ultrasound of the arm showing a small 3 cm abscess to the left forearm.  Patient does show signs and symptoms of sepsis with likely skin soft tissue source.  Patient has been covered empirically with antibiotics prior to my shift.  I spoke to Dr. Camelia Phenes on the hospitalist service who graciously excreter to accept this patient for further evaluation and management.  Dispo: Admit to medicine   Merwyn Katos, MD 11/15/22 [phone]

## 2022-11-15 NOTE — Transfer of Care (Signed)
Immediate Anesthesia Transfer of Care Note  Patient: Jeffery Knight.  Procedure(s) Performed: IRRIGATION AND DEBRIDEMENT LEFT FOREARM (Left: Arm Lower)  Patient Location: PACU  Anesthesia Type:General  Level of Consciousness: awake, alert , and oriented  Airway & Oxygen Therapy: Patient Spontanous Breathing  Post-op Assessment: Report given to RN and Post -op Vital signs reviewed and stable  Post vital signs: Reviewed and stable  Last Vitals:  Vitals Value Taken Time  BP 147/88 11/15/22 1800  Temp 37.1 C 11/15/22 1753  Pulse 109 11/15/22 1803  Resp 17 11/15/22 1803  SpO2 100 % 11/15/22 1803  Vitals shown include unfiled device data.  Last Pain:  Vitals:   11/15/22 1515  TempSrc: Oral  PainSc:          Complications: No notable events documented.

## 2022-11-15 NOTE — ED Notes (Signed)
Admitting provider at patient bedside.

## 2022-11-15 NOTE — ED Provider Notes (Signed)
[facility] Provider Note    Event Date/Time   First MD Initiated Contact with Patient 11/15/22 0403     (approximate)   History   Arm Pain and Emesis   HPI Jeffery Knight. is a 32 y.o. male who admits to regular use of IV opioids ("anything I can get") and presents with a variety of complaints including subjective fever/chills, nausea and vomiting, body aches, palpitations/rapid heartbeat, and general malaise.  He said that he last injected a site on his left forearm about 2 weeks ago.  Over the last 2 to 3 days he has developed a hard knot at that area and the rest of his symptoms throughout his body.  He said he is never felt like this before.  His mother is at bedside and reports that he has not been eating well.  He says he last used street opiates at about 4 PM yesterday.  He says that he knows some of his symptoms are due to withdrawal but he feels strongly that something else is going on.     Physical Exam   Triage Vital Signs: ED Triage Vitals  Encounter Vitals Group     BP 11/15/22 0234 (!) 114/95     Systolic BP Percentile --      Diastolic BP Percentile --      Pulse Rate 11/15/22 0234 (!) 109     Resp 11/15/22 0234 18     Temp 11/15/22 0234 99.7 F (37.6 C)     Temp Source 11/15/22 0234 Oral     SpO2 11/15/22 0234 100 %     Weight 11/15/22 0235 66.7 kg (147 lb)     Height 11/15/22 0235 1.778 m (5\' 10" )     Head Circumference --      Peak Flow --      Pain Score 11/15/22 0234 5     Pain Loc --      Pain Education --      Exclude from Growth Chart --     Most recent vital signs: Vitals:   11/15/22 0700 11/15/22 0730  BP: 119/71 113/76  Pulse: (!) 109 (!) 110  Resp:  20  Temp:  99 F (37.2 C)  SpO2: 99% 100%    General: Awake, alert, ill-appearing but nontoxic. CV:  Good peripheral perfusion.  Tachycardic, regular rhythm. Resp:  Normal effort. Speaking easily and comfortably, no accessory muscle usage nor intercostal  retractions.   Abd:  No distention.  Other:  Patient has a 2-cm indurated lesion on the top (dorsal aspect) of his left forearm.  No fluctuance , no surrounding cellulitis, patient does not report any tenderness.   ED Results / Procedures / Treatments   Labs (all labs ordered are listed, but only abnormal results are displayed) Labs Reviewed  COMPREHENSIVE METABOLIC PANEL - Abnormal; Notable for the following components:      Result Value   Potassium 3.4 (*)    Glucose, Bld 126 (*)    Total Protein 8.4 (*)    All other components within normal limits  CBC - Abnormal; Notable for the following components:   WBC 17.2 (*)    All other components within normal limits  URINALYSIS, ROUTINE W REFLEX MICROSCOPIC - Abnormal; Notable for the following components:   Color, Urine YELLOW (*)    APPearance CLEAR (*)    Ketones, ur 80 (*)    All other components within normal limits  LACTIC ACID, PLASMA - Abnormal;  Notable for the following components:   Lactic Acid, Venous 3.0 (*)    All other components within normal limits  RESP PANEL BY RT-PCR (RSV, FLU A&B, COVID)  [id number]  CULTURE, BLOOD (ROUTINE X 2)  CULTURE, BLOOD (ROUTINE X 2)  LIPASE, BLOOD  LACTIC ACID, PLASMA     RADIOLOGY Ultrasound soft tissue pending at time of transfer of care   PROCEDURES:  Critical Care performed: Yes, see critical care procedure note(s)  .1-3 Lead EKG Interpretation  Performed by: Loleta Rose, MD Authorized by: Loleta Rose, MD     Interpretation: abnormal     ECG rate:  110   ECG rate assessment: tachycardic     Rhythm: sinus tachycardia     Ectopy: none     Conduction: normal   .Critical Care  Performed by: Loleta Rose, MD Authorized by: Loleta Rose, MD   Critical care provider statement:    Critical care time (minutes):  45   Critical care was necessary to treat or prevent imminent or life-threatening deterioration of the following conditions:  Sepsis     IMPRESSION / MDM  / ASSESSMENT AND PLAN / ED COURSE  I reviewed the triage vital signs and the nursing notes.                              Differential diagnosis includes, but is not limited to, bacteremia, abscess, phlegmon, cellulitis, other unrelated issues such as viral illness.  Patient's presentation is most consistent with acute presentation with potential threat to life or bodily function.  Labs/studies ordered: Blood cultures x 2, lactic acid, respiratory viral panel, lipase, CBC, CMP, urinalysis.  Interventions/Medications given:  Medications  sodium chloride flush (NS) 0.9 % injection 10 mL (has no administration in time range)  vancomycin (VANCOREADY) IVPB 1500 mg/300 mL (1,500 mg Intravenous New Bag/Given 11/15/22 0632)  0.9 %  sodium chloride infusion (has no administration in time range)  buprenorphine-naloxone (SUBOXONE) 8-2 mg per SL tablet 1 tablet (has no administration in time range)  buprenorphine (SUBUTEX) sublingual tablet 8 mg (8 mg Sublingual Given 11/15/22 0532)  lactated ringers bolus 1,000 mL (1,000 mLs Intravenous New Bag/Given 11/15/22 0624)    And  lactated ringers bolus 1,000 mL (1,000 mLs Intravenous New Bag/Given 11/15/22 0626)  cefTRIAXone (ROCEPHIN) 2 g in sodium chloride 0.9 % 100 mL IVPB (0 g Intravenous Stopped 11/15/22 0659)  ketorolac (TORADOL) 30 MG/ML injection 15 mg (15 mg Intravenous Given 11/15/22 0635)  buprenorphine-naloxone (SUBOXONE) 8-2 mg per SL tablet 1 tablet (1 tablet Sublingual Given 11/15/22 0635)  sodium chloride 0.9 % bolus 250 mL (250 mLs Intravenous New Bag/Given 11/15/22 0738)    (Note:  hospital course my include additional interventions and/or labs/studies not listed above.)   Suspect patient is having a degree of opioid withdrawal and I ordered buprenorphine 8 mg sublingual.  Proceeding with "possible sepsis" orders but I am concerned about the possibility of bacteremia.  Patient has a normal metabolic panel but a leukocytosis of greater  than 17.  Lactic acid pending, no sign of septic shock.  He is mildly tachycardic but again this could be opioid withdrawal.  I ordered the oral rehydration order set given the current national IV fluid shortage and no clear indication he requires IV fluids at this time.  The patient is on the cardiac monitor to evaluate for evidence of arrhythmia and/or significant heart rate changes.   Clinical Course as of 11/15/22 [phone]  Sat Nov 15, 2022  4098 Lactic acid is 3.0.  Given his tachycardia, leukocytosis and lactic acid of 3, he qualifies as severe sepsis and I ordered 30 mL/kg and initiated sepsis protocol.  Ordered empiric antibiotics of vancomycin plus ceftriaxone 2 g IV.  I performed bedside ultrasound but could not verify that pseudoaneurysm is not involved with his arm abscess.  I am ordering an official ultrasound for additional assessment. [CF]    Clinical Course User Index [CF] Loleta Rose, MD     FINAL CLINICAL IMPRESSION(S) / ED DIAGNOSES   Final diagnoses:  Rigors  Sepsis, due to unspecified organism, unspecified whether acute organ dysfunction present (HCC)  Abscess of left arm  IV drug abuse (HCC)     Rx / DC Orders   ED Discharge Orders     None        Note:  This document was prepared using Dragon voice recognition software and may include unintentional dictation errors.   Loleta Rose, MD 11/15/22 [phone]

## 2022-11-15 NOTE — Plan of Care (Signed)

## 2022-11-15 NOTE — Anesthesia Procedure Notes (Signed)
Procedure Name: Intubation Date/Time: 11/15/2022 4:50 PM  Performed by: Irving Burton, CRNAPre-anesthesia Checklist: Patient identified, Patient being monitored, Timeout performed, Emergency Drugs available and Suction available Patient Re-evaluated:Patient Re-evaluated prior to induction Oxygen Delivery Method: Circle system utilized Preoxygenation: Pre-oxygenation with 100% oxygen Induction Type: IV induction and Rapid sequence Laryngoscope Size: McGraph and 4 Grade View: Grade I Tube type: Oral Tube size: 7.0 mm Number of attempts: 1 Airway Equipment and Method: Stylet and Video-laryngoscopy Placement Confirmation: ETT inserted through vocal cords under direct vision, positive ETCO2 and breath sounds checked- equal and bilateral Secured at: 21 cm Tube secured with: Tape Dental Injury: Teeth and Oropharynx as per pre-operative assessment

## 2022-11-15 NOTE — H&P (Signed)
History and Physical    Patient: Jeffery Knight. ZOX:[id number] DOB: Dec 17, 1990 DOA: 11/15/2022 DOS: the patient was seen and examined on 11/15/2022 PCP: Patient, No Pcp Per  Patient coming from: Home  Chief Complaint:  Chief Complaint  Patient presents with   Arm Pain   Emesis   HPI: Jeffery Knight. is a 32 y.o. 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  That he does using intravenous access.  Patient reports working at a [employer].  However for the last 1 month or so patient reports intermittent heart flutter sensations which are not precipitated or aggravated by anything in particular.  Patient does not report any presyncope or chest pain or sensation of shortness of breath.  However he has developed increasing fatigue over the last 1 month and he has actually not been able to go to work.  Patient states that he has been feeling "bad "for the above duration of time which he is not able to further describe.  And he stopped using his injectable heroin/fentanyl because he was not sure if it was contaminated starting 2 days ago.  For the last 3 days or so patient reports diffuse abdominal discomfort/cramping, constant diarrhea, "constant" vomiting.  Very poor appetite sensation of chest sweating and shivering.  Patient did not check his temperature at home.  He denies any sore throat ear pain, patient has chronically poor dentition but no change in condition there no chest pain no shortness of breath no coughing no dysuria.  Patient reports a new left forearm swelling and pain for the last couple of days.  Patient stopped using that left forearm for IV drug use approximately 5 days ago.  Patient has been using the right forearm since then.   Review of Systems: As mentioned in the history of present illness. All other systems reviewed and are negative. No past medical history on file. Past Surgical History:  Procedure Laterality  Date   left hand surgery      Social History:  reports that he has been smoking cigarettes. He has never used smokeless tobacco. He reports current drug use. Drug: Marijuana. He reports that he does not drink alcohol.  Allergies  Allergen Reactions   Tramadol Itching    No family history on file.  Prior to Admission medications   Medication Sig Start Date End Date Taking? Authorizing Provider  HYDROcodone-acetaminophen (NORCO/VICODIN) 5-325 MG tablet Take 1 tablet by mouth every 6 (six) hours as needed for moderate pain. 02/10/19   Tommi Rumps, PA-C  naproxen (NAPROSYN) 500 MG tablet Take 1 tablet (500 mg total) by mouth 2 (two) times daily with a meal. 02/10/19   Tommi Rumps, PA-C    Physical Exam: Vitals:   11/15/22 1000 11/15/22 1030 11/15/22 1100 11/15/22 1130  BP: 117/80 118/68 130/77 129/72  Pulse: (!) 104 (!) 104 (!) 102 (!) 110  Resp: 19 13 19  (!) 23  Temp:  97.7 F (36.5 C)    TempSrc:  Oral    SpO2: 100% 100% 100% 100%  Weight:      Height:       General: Patient has piloerection, skin is moist, obviously shivering/tremulous.  Patient is complaining of inability to sleep markedly dilted pupiels COWS is calculated at 18 by author based on :  pulse rate 101 to 120 flushed or observable moistness on face  vomiting or diarrhea. gross tremor or muscle twitching patient reports  increasing irritability or anxiousness prominent piloerrection pupils moderately dilated  Respiratory exam: Bilateral intravesicular Cardiovascular exam S1-S2 normal Abdomen all quadrants are soft nontender Extremities warm without edema Left forearm has area of induration and tenderness approximately 2 cm x 2 cm just distal to the antecubital fossa anteriorly.  There is no skin changes appreciated there seems to be an old puncture mark from prior venous access here.  Data Reviewed:  Labs on Admission:  Results for orders placed or performed during the hospital encounter of  11/15/22 (from the past 24 hour(s))  Lipase, blood     Status: None   Collection Time: 11/15/22  2:49 AM  Result Value Ref Range   Lipase 24 11 - 51 U/L  Comprehensive metabolic panel     Status: Abnormal   Collection Time: 11/15/22  2:49 AM  Result Value Ref Range   Sodium 137 135 - 145 mmol/L   Potassium 3.4 (L) 3.5 - 5.1 mmol/L   Chloride 101 98 - 111 mmol/L   CO2 25 22 - 32 mmol/L   Glucose, Bld 126 (H) 70 - 99 mg/dL   BUN 8 6 - 20 mg/dL   Creatinine, Ser 7.82 0.61 - 1.24 mg/dL   Calcium 9.3 8.9 - 42.3 mg/dL   Total Protein 8.4 (H) 6.5 - 8.1 g/dL   Albumin 4.1 3.5 - 5.0 g/dL   AST 26 15 - 41 U/L   ALT 27 0 - 44 U/L   Alkaline Phosphatase 76 38 - 126 U/L   Total Bilirubin 1.0 0.3 - 1.2 mg/dL   GFR, Estimated >53 >61 mL/min   Anion gap 11 5 - 15  CBC     Status: Abnormal   Collection Time: 11/15/22  2:49 AM  Result Value Ref Range   WBC 17.2 (H) 4.0 - 10.5 K/uL   RBC 5.64 4.22 - 5.81 MIL/uL   Hemoglobin 16.1 13.0 - 17.0 g/dL   HCT 44.3 15.4 - 00.8 %   MCV 83.2 80.0 - 100.0 fL   MCH 28.5 26.0 - 34.0 pg   MCHC 34.3 30.0 - 36.0 g/dL   RDW 67.6 19.5 - 09.3 %   Platelets 362 150 - 400 K/uL   nRBC 0.0 0.0 - 0.2 %  Resp panel by RT-PCR (RSV, Flu A&B, Covid) Anterior Nasal Swab     Status: None   Collection Time: 11/15/22  2:49 AM   Specimen: Anterior Nasal Swab  Result Value Ref Range   SARS Coronavirus 2 by RT PCR NEGATIVE NEGATIVE   Influenza A by PCR NEGATIVE NEGATIVE   Influenza B by PCR NEGATIVE NEGATIVE   Resp Syncytial Virus by PCR NEGATIVE NEGATIVE  Lactic acid, plasma     Status: Abnormal   Collection Time: 11/15/22  4:55 AM  Result Value Ref Range   Lactic Acid, Venous 3.0 (HH) 0.5 - 1.9 mmol/L  Blood Culture (routine x 2)     Status: None (Preliminary result)   Collection Time: 11/15/22  4:55 AM   Specimen: BLOOD  Result Value Ref Range   Specimen Description BLOOD RIGHT ANTECUBITAL    Special Requests      BOTTLES DRAWN AEROBIC AND ANAEROBIC Blood Culture  adequate volume   Culture      NO GROWTH <12 HOURS Performed at [facility], [address] [place], [place] [address]    Report Status PENDING   Blood Culture (routine x 2)     Status: None (Preliminary result)   Collection Time: 11/15/22  4:55 AM   Specimen: BLOOD  Result Value Ref Range   Specimen Description BLOOD LEFT ANTECUBITAL    Special Requests      BOTTLES DRAWN AEROBIC AND ANAEROBIC Blood Culture adequate volume   Culture      NO GROWTH <12 HOURS Performed at [facility], [address] [place], [place] [address]    Report Status PENDING   Urinalysis, Routine w reflex microscopic -Urine, Clean Catch     Status: Abnormal   Collection Time: 11/15/22  6:00 AM  Result Value Ref Range   Color, Urine YELLOW (A) YELLOW   APPearance CLEAR (A) CLEAR   Specific Gravity, Urine 1.020 1.005 - 1.030   pH 6.0 5.0 - 8.0   Glucose, UA NEGATIVE NEGATIVE mg/dL   Hgb urine dipstick NEGATIVE NEGATIVE   Bilirubin Urine NEGATIVE NEGATIVE   Ketones, ur 80 (A) NEGATIVE mg/dL   Protein, ur NEGATIVE NEGATIVE mg/dL   Nitrite NEGATIVE NEGATIVE   Leukocytes,Ua NEGATIVE NEGATIVE  Lactic acid, plasma     Status: Abnormal   Collection Time: 11/15/22  8:38 AM  Result Value Ref Range   Lactic Acid, Venous 2.9 (HH) 0.5 - 1.9 mmol/L  T4, free     Status: Abnormal   Collection Time: 11/15/22 11:57 AM  Result Value Ref Range   Free T4 1.34 (H) 0.61 - 1.12 ng/dL   Basic Metabolic Panel: Recent Labs  Lab 11/15/22 0249  NA 137  K 3.4*  CL 101  CO2 25  GLUCOSE 126*  BUN 8  CREATININE 0.78  CALCIUM 9.3   Liver Function Tests: Recent Labs  Lab 11/15/22 0249  AST 26  ALT 27  ALKPHOS 76  BILITOT 1.0  PROT 8.4*  ALBUMIN 4.1   Recent Labs  Lab 11/15/22 0249  LIPASE 24   No results for input(s): "AMMONIA" in the last 168 hours. CBC: Recent Labs  Lab 11/15/22 0249  WBC 17.2*  HGB 16.1  HCT 46.9  MCV 83.2  PLT 362   Cardiac Enzymes: No results for  input(s): "CKTOTAL", "CKMB", "CKMBINDEX", "TROPONINIHS" in the last 168 hours.  BNP (last 3 results) No results for input(s): "PROBNP" in the last 8760 hours. CBG: No results for input(s): "GLUCAP" in the last 168 hours.  Radiological Exams on Admission:  [place] LT UPPER EXTREM LTD SOFT TISSUE NON VASCULAR  Result Date: 11/15/2022 CLINICAL DATA:  Abscess, IV drug abuse EXAM: ULTRASOUND LEFT UPPER EXTREMITY LIMITED TECHNIQUE: Ultrasound examination of the upper extremity soft tissues was performed in the area of clinical concern. COMPARISON:  None Available. FINDINGS: In the area of concern, a complex subcutaneous hypoechoic lesion along the superficial fascia margin measures 2.7 by 1.1 by 2.0 cm (volume = 3.1 cm^3). A small abscess is likely. IMPRESSION: 1. Small abscess in the area of concern, measuring 2.7 by 1.1 by 2.0 cm. Electronically Signed   By: Gaylyn Rong M.D.   On: 11/15/2022 10:04    EKG: Independently reviewed. Qtc under 500 msec   Assessment and Plan: Abscess On physical exam patient has more often in duration than a swelling that is tender on the left forearm as described in physical exam.  Therefore initially I had only intermediate concern for an abscess, there is no pointing evident no marked erythema.  But [place] is showing a deep abscess.  Blood cultures have already been sent and patient has been started on vancomycin and ceftriaxone, continue with vancomycin at this time.  Distal function is intact  Enteritis This  is likely part and parcel of opiate withdrawal in this patient.  Antibiotics will be continued for abscess, see separate problem.  I will check stool for C. difficile and pathogen panel  Palpitation Patient reports complaints of palpitations as well as fatigue that started approximately a month ago that were severe enough to interfere with his activities of work.  At this time I will check a thyroid Skains cascade and maintain the patient on telemetry.  Will  also check an echo  Opioid abuse (HCC) Patient has been using fentanyl and heroin for approximately 7 years, patient reports that he stopped using above agents approximately 2 days ago because he was concerned that there was some contamination in the supply given his symptoms of palpitations.  Patient is currently having active withdrawal manifested by diarrhea vomiting piloerection pupillary dilation as well as sweating.  Patient has received approximately 24 mg of buprenorphine in various formulations since approximately before 8 AM today.  In spite of this patient is at least having moderate withdrawal.  This would be Antis expected given given limited efficacy of buprenorphine against withdrawal from fentanyl.  Patient's EKG is currently showing QTc less than 500 ms.  At this time I will treat the patient with 30 mg of methadone.  And proceed accordingly.  Continue with monitoring patient withdrawal score every 4 hours.      Advance Care Planning:   Code Status: Not on file full code  Consults:  I spoke with Dr. Joice Lofts. He plans on I&D this afternoon. Given active sepsis picture this is a situation that warrants drainage. I will control withdrawal with methadone as above.  Family Communication: discussed above with patient.  Severity of Illness: The appropriate patient status for this patient is INPATIENT. Inpatient status is judged to be reasonable and necessary in order to provide the required intensity of service to ensure the patient's safety. The patient's presenting symptoms, physical exam findings, and initial radiographic and laboratory data in the context of their chronic comorbidities is felt to place them at high risk for further clinical deterioration. Furthermore, it is not anticipated that the patient will be medically stable for discharge from the hospital within 2 midnights of admission.   * I certify that at the point of admission it is my clinical judgment that the patient will  require inpatient hospital care spanning beyond 2 midnights from the point of admission due to high intensity of service, high risk for further deterioration and high frequency of surveillance required.*  Author: Nolberto Hanlon, MD 11/15/2022 12:13 PM  For on call review [email or web address].

## 2022-11-15 NOTE — ED Notes (Signed)
Patient reports chills, nausea, vomiting, body shivers, and "feet freezing cold." Patient is afebrile; given warm blanket and provided comfort measures.

## 2022-11-15 NOTE — Progress Notes (Signed)
CODE SEPSIS - PHARMACY COMMUNICATION  **Broad Spectrum Antibiotics should be administered within 1 hour of Sepsis diagnosis**  Time Code Sepsis Called/Page Received:  10/26 @ 0609   Antibiotics Ordered: Ceftriaxone , Vancomycin   Time of 1st antibiotic administration: Ceftriaxone 2 gm IV X 1 on 10/26 @ 0628  Additional action taken by pharmacy:   If necessary, Name of Provider/Nurse Contacted:     Traxton Kolenda D ,PharmD Clinical Pharmacist  11/15/2022  6:55 AM

## 2022-11-16 ENCOUNTER — Inpatient Hospital Stay: Payer: 59

## 2022-11-16 ENCOUNTER — Encounter: Payer: Self-pay | Admitting: Surgery

## 2022-11-16 DIAGNOSIS — K529 Noninfective gastroenteritis and colitis, unspecified: Secondary | ICD-10-CM

## 2022-11-16 LAB — CBC
HCT: 35.8 % — ABNORMAL LOW (ref 39.0–52.0)
Hemoglobin: 12.8 g/dL — ABNORMAL LOW (ref 13.0–17.0)
MCH: 29.1 pg (ref 26.0–34.0)
MCHC: 35.8 g/dL (ref 30.0–36.0)
MCV: 81.4 fL (ref 80.0–100.0)
Platelets: 330 10*3/uL (ref 150–400)
RBC: 4.4 MIL/uL (ref 4.22–5.81)
RDW: 13 % (ref 11.5–15.5)
WBC: 22.7 10*3/uL — ABNORMAL HIGH (ref 4.0–10.5)
nRBC: 0 % (ref 0.0–0.2)

## 2022-11-16 LAB — HIV ANTIBODY (ROUTINE TESTING W REFLEX): HIV Screen 4th Generation wRfx: NONREACTIVE

## 2022-11-16 LAB — RPR: RPR Ser Ql: NONREACTIVE

## 2022-11-16 LAB — BASIC METABOLIC PANEL
Anion gap: 9 (ref 5–15)
BUN: 7 mg/dL (ref 6–20)
CO2: 21 mmol/L — ABNORMAL LOW (ref 22–32)
Calcium: 8.7 mg/dL — ABNORMAL LOW (ref 8.9–10.3)
Chloride: 110 mmol/L (ref 98–111)
Creatinine, Ser: 0.77 mg/dL (ref 0.61–1.24)
GFR, Estimated: >60 mL/min (ref >60)
Glucose, Bld: 106 mg/dL — ABNORMAL HIGH (ref 70–99)
Potassium: 2.9 mmol/L — ABNORMAL LOW (ref 3.5–5.1)
Sodium: 140 mmol/L (ref 135–145)

## 2022-11-16 LAB — PROTIME-INR
INR: 1.1 (ref 0.8–1.2)
Prothrombin Time: 14.8 s (ref 11.4–15.2)

## 2022-11-16 LAB — LACTIC ACID, PLASMA: Lactic Acid, Venous: 0.9 mmol/L (ref 0.5–1.9)

## 2022-11-16 LAB — TSH: TSH: 0.584 u[IU]/mL (ref 0.350–4.500)

## 2022-11-16 LAB — PHOSPHORUS: Phosphorus: 2.9 mg/dL (ref 2.5–4.6)

## 2022-11-16 LAB — MAGNESIUM: Magnesium: 1.7 mg/dL (ref 1.7–2.4)

## 2022-11-16 LAB — APTT: aPTT: 23 s — ABNORMAL LOW (ref 24–36)

## 2022-11-16 LAB — T4, FREE: Free T4: 1.41 ng/dL — ABNORMAL HIGH (ref 0.61–1.12)

## 2022-11-16 MED ORDER — BUPRENORPHINE HCL 2 MG SL SUBL
4.0000 mg | SUBLINGUAL_TABLET | Freq: Three times a day (TID) | SUBLINGUAL | Status: DC
  Administered 2022-11-16: 4 mg via SUBLINGUAL
  Filled 2022-11-16: qty 2

## 2022-11-16 MED ORDER — SODIUM CHLORIDE 0.9 % IV SOLN
1.0000 g | INTRAVENOUS | Status: DC
  Administered 2022-11-16: 1 g via INTRAVENOUS
  Filled 2022-11-16: qty 10

## 2022-11-16 MED ORDER — PANTOPRAZOLE SODIUM 40 MG IV SOLR
40.0000 mg | Freq: Two times a day (BID) | INTRAVENOUS | Status: DC
  Administered 2022-11-16: 40 mg via INTRAVENOUS
  Filled 2022-11-16: qty 10

## 2022-11-16 MED ORDER — METHOCARBAMOL 1000 MG/10ML IJ SOLN
1000.0000 mg | Freq: Once | INTRAMUSCULAR | Status: AC
  Administered 2022-11-16: 1000 mg via INTRAVENOUS
  Filled 2022-11-16: qty 10

## 2022-11-16 MED ORDER — SODIUM CHLORIDE 0.9 % IV SOLN: INTRAVENOUS | Status: DC

## 2022-11-16 MED ORDER — BUPRENORPHINE HCL 2 MG SL SUBL: 1.0000 mg | SUBLINGUAL_TABLET | Freq: Three times a day (TID) | SUBLINGUAL | Status: DC

## 2022-11-16 MED ORDER — LOPERAMIDE HCL 2 MG PO CAPS: 2.0000 mg | ORAL_CAPSULE | Freq: Three times a day (TID) | ORAL | Status: DC | PRN

## 2022-11-16 MED ORDER — POTASSIUM CHLORIDE 10 MEQ/100ML IV SOLN
10.0000 meq | INTRAVENOUS | Status: AC
  Administered 2022-11-16 (×4): 10 meq via INTRAVENOUS
  Filled 2022-11-16 (×4): qty 100

## 2022-11-16 MED ORDER — METHIMAZOLE 5 MG PO TABS
15.0000 mg | ORAL_TABLET | Freq: Every day | ORAL | Status: DC
  Administered 2022-11-16: 15 mg via ORAL
  Filled 2022-11-16: qty 1

## 2022-11-16 MED ORDER — POTASSIUM CHLORIDE CRYS ER 20 MEQ PO TBCR
40.0000 meq | EXTENDED_RELEASE_TABLET | ORAL | Status: DC
  Filled 2022-11-16 (×2): qty 2

## 2022-11-16 MED ORDER — LORAZEPAM 2 MG/ML IJ SOLN: 2.0000 mg | Freq: Once | INTRAMUSCULAR | Status: DC

## 2022-11-16 MED ORDER — LORAZEPAM 2 MG/ML IJ SOLN
2.0000 mg | INTRAMUSCULAR | Status: AC
  Administered 2022-11-16: 2 mg via INTRAVENOUS
  Filled 2022-11-16: qty 1

## 2022-11-16 MED ORDER — POTASSIUM CHLORIDE CRYS ER 20 MEQ PO TBCR: 40.0000 meq | EXTENDED_RELEASE_TABLET | Freq: Once | ORAL | Status: DC

## 2022-11-16 MED ORDER — BUPRENORPHINE HCL 2 MG SL SUBL: 2.0000 mg | SUBLINGUAL_TABLET | Freq: Three times a day (TID) | SUBLINGUAL | Status: DC

## 2022-11-16 NOTE — Progress Notes (Signed)
       CROSS COVER NOTE  NAME: Jeffery Knight. MRN: [id number] DOB : 09/01/1990    Concern as stated by nurse / staff   Patient with worsening wopioid withdrawal symptoms and no leep despite valium given earlier and having muscle spasm right arm/hand     Pertinent findings on chart review: Records/ labs/ vitals reviewed  Assessment and  Interventions   Assessment: Patient is alert and oriented.  Muscle spasm right hand arm and neck. He does understand he is having withdrawal symptoms. Admits longest time of abstinence has been 14 days in past.  Nurse reports mother thinks he is just anxious. He is receiving max therapies for antihistamine and antiemetic therapy. Trazodone and valium new my shift with no effect Plan: Lorazepam 2 mg x1 Robaxin 1000 IV x1       Donnie Mesa NP [facility] Cross Cover 7pm-7am - check amion for availability Pager [phone]

## 2022-11-16 NOTE — Progress Notes (Signed)
Primary nurse notified administrator coordinator that patient left against medical advice. Patient removed all medical equipment including intravenous needles. IV's were found by staff in sink. Patient did not sign AMA papers. Parent was at the bedside. A security alert was initiated about 1945. Multiple attempts were made to contact parent but was unsuccessful. Security video shows patient leaving with parent out of the medical mall entry doors at 1958. Security notified local police department. Police verified with parent that patient left with her and was not harmed. Patient was dropped of at an unknown location by mother.

## 2022-11-16 NOTE — TOC Progression Note (Signed)
Transition of Care (TOC) - Progression Note    Patient Details  Name: Jeffery Knight. MRN: [id number] Date of Birth: 1990-08-09  Transition of Care [facility]) CM/SW Contact  Susa Simmonds, [place] Phone Number: 11/16/2022, 11:44 AM  Clinical Narrative:  CSW received a message from PT that patient has poor living conditions. Patient currently lives in a shed on his friends property. Patient is an active drug user, which limits his options to stay with family and friends.  CSW spoke to patient who stated he doesn't want residential treatment at this time and would like the resources for outpatient treatment. Patient also stated he didn't need shelter resources at this time but asked CSW to attach the resources to his paperwork. CSW attached residential/outpatient treatment facilities and shelter resources to patients AVS.          Expected Discharge Plan and Services                                               Social Determinants of Health (SDOH) Interventions SDOH Screenings   Tobacco Use: High Risk (11/16/2022)    Readmission Risk Interventions     No data to display

## 2022-11-16 NOTE — Progress Notes (Signed)
Pt appears to have left AMA with his mother.  Upon reprt this RN entered the pts room to introduce myself, at this point the pt stated that he was ready to go home.  I discussed with him that the plan was for him to stay overnight and the Dr. would determine his readiness for discharge in the morning.  Both the pt and his mother agreed to this plan.  This RN then went to speak to another pt.  After approximately 10 minutes this RN returned to the room to administer the pts 2000 meds and found the room to be empty, with the pts telemetry monitor lying in the bed and the IV tubing disconnected.  I further was able to find both of the pts IVs had been removed and were left in the sink.  Pts MD notified, security notified, [facility] notified.  This RN attempted to call pts mother x3 at the number listed in the chart but no one answered.

## 2022-11-16 NOTE — Progress Notes (Signed)
A consult was placed to the IV Nurse; pt needing new IV access;  limited to R arm only; ultrasound used to assess; very few "healthy", compressible veins noted; able to place a PIV in the RUA; suggest a PICC if this pt will need continued IV access for labs and antibiotics.

## 2022-11-16 NOTE — Plan of Care (Signed)

## 2022-11-16 NOTE — Progress Notes (Addendum)
Subjective: 1 Day Post-Op Procedure(s) (LRB): IRRIGATION AND DEBRIDEMENT LEFT FOREARM (Left) Patient reports pain as moderate.   Patient seen in rounds with Dr. Joice Lofts. Patient is well, and has had no acute complaints or problems. States he has felt a little warm, but denies any true fevers. Denies CP, SOB, N/V Plan is to go Home after hospital stay.  Objective: Vital signs in last 24 hours: Temp:  [97.8 F (36.6 C)-99.7 F (37.6 C)] 99 F (37.2 C) (10/27 0830) Pulse Rate:  [89-116] 107 (10/27 1158) Resp:  [14-37] 18 (10/27 1158) BP: (114-147)/(59-95) 127/92 (10/27 1158) SpO2:  [96 %-100 %] 100 % (10/27 1158)  Intake/Output from previous day:  Intake/Output Summary (Last 24 hours) at 11/16/2022 1217 Last data filed at 11/16/2022 0702 Gross per 24 hour  Intake 2813.11 ml  Output 605 ml  Net 2208.11 ml    Intake/Output this shift: Total I/O In: 514.3 [I.V.:514.3] Out: -   Labs: Recent Labs    11/15/22 0249 11/16/22 0503  HGB 16.1 12.8*   Recent Labs    11/15/22 0249 11/16/22 0503  WBC 17.2* 22.7*  RBC 5.64 4.40  HCT 46.9 35.8*  PLT 362 330   Recent Labs    11/15/22 0249 11/16/22 0850  NA 137 140  K 3.4* 2.9*  CL 101 110  CO2 25 21*  BUN 8 7  CREATININE 0.78 0.77  GLUCOSE 126* 106*  CALCIUM 9.3 8.7*   Recent Labs    11/16/22 0503  INR 1.1    EXAM General - Patient is Alert, Appropriate, and Oriented Extremity - Neurologically intact ABD soft Neurovascular intact Sensation intact distally Intact pulses distally Compartment soft Dressing - dressing C/D/I and scant drainage Motor Function - intact, able to flex, extend and abduct fingers. Neurovascularly intact to all dermatomes of his left arm.  Radial pulses appreciated, 2+ Penrose drain in place and intact  No past medical history on file.  Assessment/Plan: 1 Day Post-Op Procedure(s) (LRB): IRRIGATION AND DEBRIDEMENT LEFT FOREARM (Left) Principal Problem:   Enteritis Active  Problems:   Opioid abuse (HCC)   Palpitation   Abscess  Estimated body mass index is 21.09 kg/m as calculated from the following:   Height as of this encounter: 5\' 10"  (1.778 m).   Weight as of this encounter: 66.7 kg.  Patient is day 1 s/p I&D of left forearm abscess  VSS  Discussed using pain meds appropriately to help with discomfort  Patient noted to have decreased potassium, 2.9 this morning.  Inpatient medicine is aware.  Continue to trend these values  WBC count up to 22.7 today, hemoglobin at 12.8  Continue to work with PT, remain in splint  Penrose drain in place, will look to advance this drain tomorrow, and remove on Tuesday  Culture results show few gram-positive cocci, waiting for specific species  Will continue with vancomycin and antibiotic regimen per hospital team  Will look to DC home when medically stable, plan to follow up with [facility] for revaluation and wound check  Rayburn Go, PA-C [facility] 11/16/2022, 12:17 PM

## 2022-11-16 NOTE — Progress Notes (Signed)
[facility] Progress Note  Patient: Jeffery Knight.    ZOX:[id number]  DOA: 11/15/2022     Date of Service: the patient was seen and examined on 11/16/2022  Chief Complaint  Patient presents with   Arm Pain   Emesis   Brief hospital course: Jeffery Knight. is a 32 y.o. 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  Patient reported palpitations for past 1 month, increased fatigue but he has been going to the work.  Patient stopped using heroin/fentanyl 2 days ago, having diffuse abdominal discomfort, cramps, intractable nausea, vomiting and diarrhea, low appetite.  Patient reported left arm swelling and pain for past few days, and patient stopped using left forearm for IV drugs for approximately 5 days ago and he has been using right forearm since then.  ED workup: VS tachycardia heart rate 102--113, BP soft and stable, RR 13--28, afebrile 100% on room air Hypokalemia potassium 3.4, serum glucose 126, slightly elevated.  Troponin 91 Lactic acid 3.0, WBC 17.2 TSH 0.5, free T41.34 Elevated, PR NR, negative COVID, flu and RSV Negative C. difficile, GI pathogen negative UDS positive for opiates and THC [place] left arm:  Small abscess in the area of concern, measuring 2.7 by 1.1 by 2.0 cm. General surgery was consulted.  TRH was consulted for admission and further management as below  Assessment and Plan:  # Sepsis secondary to left forearm cellulitis/abscess Continue ceftriaxone and vancomycin, pharmacy consulted for dosing and drug monitoring General Surgery consulted, s/p I&D Lactic acid 3.0----0.9 resolved Blood culture NGTD Continue dressing changes as per ID Continue Tylenol and Toradol for now for pain control as per general surgery Follow fluid culture Trend WBC count  # Gastroenteritis could be secondary to THC abuse and opiate withdrawal symptoms Continue IV fluid for hydration Symptomatic  treatment for nausea vomiting and diarrhea Replace electrolytes Continue PPI for GI prophylaxis  # Hypokalemia, potassium repleted. Monitor electrolytes and replete as needed.  # Hyperthyroidism TSH 0.5, free T4 level 1.34---1.41 elevated 10/27 started methimazole 15 mg p.o. daily Follow thyroid antibodies and thyroid sonogram Patient presented with palpitations TTE LVEF 6065%, no wall motion abnormality, no any other significant findings Recommend to follow-up with endocrinology as an outpatient for further management  # Opiate withdrawals Patient was using fentanyl IV and marijuana Watch for withdrawal symptoms Started Subutex 4 mg SL TID x 2 days, 2 mg TID x 2 days and 1 mg TID x 1 day to detox  # Marijuana abuse, drug abuse abstinence counseling done. TOC consulted for substance abuse counseling and resources.  Body mass index is 21.09 kg/m.  Interventions:   Diet: Regular diet DVT Prophylaxis: Subcutaneous Lovenox   Advance goals of care discussion: Full code  Family Communication: family was not present at bedside, at the time of interview.  The pt provided permission to discuss medical plan with the family. Opportunity was given to ask question and all questions were answered satisfactorily.   Disposition:  Pt is from Home, admitted with sepsis due to left forearm infection, hypothyroidism and opiate withdrawals, still has tractable N/V/D, electrolyte imbalance and on IV antibiotics, which precludes a safe discharge. Discharge to home, when stable, may need few days stay to detox..  Subjective: No significant overnight events, patient has pain in the left forearm 5/10, feels shivering and cramps.  Still has nausea and vomiting and diarrhea.  No any other complaints.  Physical Exam: General: NAD, lying comfortably Appear in no distress, affect appropriate Eyes: PERRLA ENT: Oral Mucosa Clear, moist  Neck: no JVD,  Cardiovascular: S1 and S2 Present, no Murmur,   Respiratory: good respiratory effort, Bilateral Air entry equal and Decreased, no Crackles, no wheezes Abdomen: Bowel Sound present, Soft and no tenderness,  Skin: no rashes Extremities: no Pedal edema, no calf tenderness. Left forearm s/p I&D, dressing CDI Neurologic: without any new focal findings Gait not checked due to patient safety concerns  Vitals:   11/16/22 0100 11/16/22 0400 11/16/22 0500 11/16/22 0830  BP: (!) 140/77 138/82  (!) 114/59  Pulse:  (!) 104  (!) 108  Resp: (!) 27 15 (!) 35 (!) 27  Temp: 99.7 F (37.6 C) 99.6 F (37.6 C)  99 F (37.2 C)  TempSrc: Oral Oral  Oral  SpO2: 96%   97%  Weight:      Height:        Intake/Output Summary (Last 24 hours) at 11/16/2022 1112 Last data filed at 11/16/2022 4132 Gross per 24 hour  Intake 2813.11 ml  Output 605 ml  Net 2208.11 ml   Filed Weights   11/15/22 0235  Weight: 66.7 kg    Data Reviewed: I have personally reviewed and interpreted daily labs, tele strips, imagings as discussed above. I reviewed all nursing notes, pharmacy notes, vitals, pertinent old records I have discussed plan of care as described above with RN and patient/family.  CBC: Recent Labs  Lab 11/15/22 0249 11/16/22 0503  WBC 17.2* 22.7*  HGB 16.1 12.8*  HCT 46.9 35.8*  MCV 83.2 81.4  PLT 362 330   Basic Metabolic Panel: Recent Labs  Lab 11/15/22 0249 11/16/22 0850  NA 137 140  K 3.4* 2.9*  CL 101 110  CO2 25 21*  GLUCOSE 126* 106*  BUN 8 7  CREATININE 0.78 0.77  CALCIUM 9.3 8.7*  MG  --  1.7  PHOS  --  2.9    Studies: US THYROID  Result Date: 11/16/2022 CLINICAL DATA:  Hyperthyroidism EXAM: THYROID ULTRASOUND TECHNIQUE: Ultrasound examination of the thyroid gland and adjacent soft tissues was performed. COMPARISON:  None available. FINDINGS: Parenchymal Echotexture: Normal Isthmus: 0.3 cm Right lobe: 4.5 x 1.3 x 1.4 cm Left lobe: 4.5 x 1.2 x 1.3 cm _________________________________________________________ Estimated  total number of nodules >/= 1 cm: 0 Number of spongiform nodules >/=  2 cm not described below (TR1): 0 Number of mixed cystic and solid nodules >/= 1.5 cm not described below (TR2): 0 _________________________________________________________ No discrete nodules are seen within the thyroid gland. IMPRESSION: Normal thyroid ultrasound The above is in keeping with the ACR TI-RADS recommendations - J Am Coll Radiol 2017;14:587-595. Electronically Signed   By: Acquanetta Belling M.D.   On: 11/16/2022 09:37   ECHOCARDIOGRAM COMPLETE  Result Date: 11/15/2022    ECHOCARDIOGRAM REPORT   Patient Name:   Jaylen Orne. Date of Exam: 11/15/2022 Medical Rec #:  [id number]           Height:       70.0 in Accession #:    [id number]          Weight:       147.0 lb Date of Birth:  1990-10-20           BSA:          1.831 m Patient Age:    32 years            BP:  129/72 mmHg Patient Gender: M                   HR:           106 bpm. Exam Location:  [facility] Procedure: 2D Echo Indications:     Endocarditis I38  History:         Patient has no prior history of Echocardiogram examinations.  Sonographer:     Elwin Sleight RDCS Referring Phys:  [id number] [facility] GOEL Diagnosing Phys: Julien Nordmann MD  Sonographer Comments: Suboptimal parasternal window, suboptimal apical window and suboptimal subcostal window. Image acquisition challenging due to respiratory motion. IMPRESSIONS  1. Left ventricular ejection fraction, by estimation, is 60 to 65%. The left ventricle has normal function. The left ventricle has no regional wall motion abnormalities. Left ventricular diastolic parameters were normal.  2. Right ventricular systolic function is normal. The right ventricular size is normal. Tricuspid regurgitation signal is inadequate for assessing PA pressure.  3. The mitral valve is normal in structure. No evidence of mitral valve regurgitation. No evidence of mitral stenosis.  4. The aortic valve has an indeterminant number of cusps.  Aortic valve regurgitation is not visualized. No aortic stenosis is present.  5. The inferior vena cava is normal in size with greater than 50% respiratory variability, suggesting right atrial pressure of 3 mmHg. FINDINGS  Left Ventricle: Left ventricular ejection fraction, by estimation, is 60 to 65%. The left ventricle has normal function. The left ventricle has no regional wall motion abnormalities. The left ventricular internal cavity size was normal in size. There is  no left ventricular hypertrophy. Left ventricular diastolic parameters were normal. Right Ventricle: The right ventricular size is normal. No increase in right ventricular wall thickness. Right ventricular systolic function is normal. Tricuspid regurgitation signal is inadequate for assessing PA pressure. Left Atrium: Left atrial size was normal in size. Right Atrium: Right atrial size was normal in size. Pericardium: There is no evidence of pericardial effusion. Mitral Valve: The mitral valve is normal in structure. No evidence of mitral valve regurgitation. No evidence of mitral valve stenosis. Tricuspid Valve: The tricuspid valve is normal in structure. Tricuspid valve regurgitation is not demonstrated. No evidence of tricuspid stenosis. Aortic Valve: The aortic valve has an indeterminant number of cusps. Aortic valve regurgitation is not visualized. No aortic stenosis is present. Aortic valve peak gradient measures 12.7 mmHg. Pulmonic Valve: The pulmonic valve was normal in structure. Pulmonic valve regurgitation is not visualized. No evidence of pulmonic stenosis. Aorta: The aortic root is normal in size and structure. Venous: The inferior vena cava is normal in size with greater than 50% respiratory variability, suggesting right atrial pressure of 3 mmHg. IAS/Shunts: No atrial level shunt detected by color flow Doppler.  LEFT VENTRICLE PLAX 2D LVIDd:         3.90 cm   Diastology LVIDs:         2.10 cm   LV e' medial:    14.50 cm/s LV PW:          0.90 cm   LV E/e' medial:  5.8 LV IVS:        0.90 cm   LV e' lateral:   17.00 cm/s LVOT diam:     1.90 cm   LV E/e' lateral: 4.9 LV SV:         81 LV SV Index:   44 LVOT Area:     2.84 cm  RIGHT VENTRICLE RV Basal diam:  2.20 cm RV  S prime:     24.60 cm/s TAPSE (M-mode): 2.6 cm LEFT ATRIUM             Index        RIGHT ATRIUM           Index LA diam:        2.90 cm 1.58 cm/m   RA Area:     16.20 cm LA Vol (A2C):   24.6 ml 13.43 ml/m  RA Volume:   44.40 ml  24.24 ml/m LA Vol (A4C):   27.7 ml 15.13 ml/m LA Biplane Vol: 26.9 ml 14.69 ml/m  AORTIC VALVE                  PULMONIC VALVE AV Area (Vmax): 2.79 cm      PV Vmax:        1.75 m/s AV Vmax:        178.00 cm/s   PV Peak grad:   12.2 mmHg AV Peak Grad:   12.7 mmHg     RVOT Peak grad: 12 mmHg LVOT Vmax:      175.00 cm/s LVOT Vmean:     125.000 cm/s LVOT VTI:       0.284 m  AORTA Ao Root diam: 3.20 cm MITRAL VALVE MV Area (PHT): 4.86 cm     SHUNTS MV Decel Time: 156 msec     Systemic VTI:  0.28 m MV E velocity: 83.90 cm/s   Systemic Diam: 1.90 cm MV A velocity: 104.00 cm/s MV E/A ratio:  0.81 Julien Nordmann MD Electronically signed by Julien Nordmann MD Signature Date/Time: 11/15/2022/6:37:03 PM    Final     Scheduled Meds:  acetaminophen  1,000 mg Oral Q6H   buprenorphine  4 mg Sublingual Q8H   Followed by   Melene Muller ON 11/18/2022] buprenorphine  2 mg Sublingual Q8H   Followed by   Melene Muller ON 11/20/2022] buprenorphine  1 mg Sublingual Q8H   docusate sodium  100 mg Oral BID   enoxaparin (LOVENOX) injection  40 mg Subcutaneous Q24H   ketorolac  15 mg Intravenous Q6H   pantoprazole (PROTONIX) IV  40 mg Intravenous Q12H   potassium chloride  40 mEq Oral Once   sodium chloride flush  10 mL Intravenous Q12H   sodium chloride flush  3 mL Intravenous Q12H   Continuous Infusions:  potassium chloride 10 mEq (11/16/22 1033)   vancomycin 750 mg (11/16/22 0522)   PRN Meds: acetaminophen, bisacodyl, diphenhydrAMINE, loperamide, magnesium hydroxide,  metoCLOPramide **OR** metoCLOPramide (REGLAN) injection, ondansetron **OR** ondansetron (ZOFRAN) IV, sodium phosphate  Time spent: 55 minutes  Author: Gillis Santa. MD [facility] 11/16/2022 11:12 AM  To reach On-call, see care teams to locate the attending and reach out to them via [email or web address]. If 7PM-7AM, please contact night-coverage If you still have difficulty reaching the attending provider, please page the [facility] (Director on Call) for [facility] on amion for assistance.

## 2022-11-16 NOTE — Evaluation (Signed)
Physical Therapy Evaluation Patient Details Name: Jeffery Knight. MRN: [id number] DOB: 03-14-1990 Today's Date: 11/16/2022  History of Present Illness  Jeffery Knight is a 32yoM who comes to [facility] on 11/15/22 c swollen area on forearm after self injection. Also c/o anxiety, emesis, annorexia. Pt underwent I/D of left forwarm abscess with Dr. Joice Lofts. Free T4 elevated.  Clinical Impression  Pt in bed on entry, IV K+ running. Pt reports having cramping, weakness, paresthesia in Right hand volar digits 2 and 3 (nonopperative side). Left hands is appropriately sore and splinted, otherwise WNL. Pt comes to EOB without difficulty, some lightheadedness. Stands without assist, but requires close supervision due to unsteadiness and repeat LOB. After being up for 3-4 minutes, pt tries to lean on author to rest due to fatigue. Pt asked to return to bedside. Pt remains nauseated, no eating much, has emesis up until this morning still. Will continue to follow. Current living situation is not ideal for receiving services at DC, have reached out to LCSW to formulate a plan.       If plan is discharge home, recommend the following: A little help with walking and/or transfers;Assistance with cooking/housework;Assistance with feeding;Help with stairs or ramp for entrance;Direct supervision/assist for financial management;Direct supervision/assist for medications management;Supervision due to cognitive status;Assist for transportation   Can travel by private vehicle        Equipment Recommendations None recommended by PT  Recommendations for Other Services       Functional Status Assessment Patient has had a recent decline in their functional status and demonstrates the ability to make significant improvements in function in a reasonable and predictable amount of time.     Precautions / Restrictions Precautions Precautions: Fall Restrictions Weight Bearing Restrictions: No      Mobility  Bed  Mobility Overal bed mobility: Modified Independent Bed Mobility: Supine to Sit     Supine to sit: Supervision     General bed mobility comments: little dizzy at EOB;    Transfers Overall transfer level: Needs assistance Equipment used: None Transfers: Sit to/from Stand Sit to Stand: Supervision           General transfer comment: labored, uncoordinted, unsteady and imbalanced at EOB;    Ambulation/Gait Ambulation/Gait assistance: Contact guard assist, Min assist Gait Distance (Feet): 12 Feet Assistive device: None         General Gait Details: alternate AMB/retro to windowsil; LOB with each retro AMB attempt  Stairs            Wheelchair Mobility     Tilt Bed    Modified Rankin (Stroke Patients Only)       Balance Overall balance assessment: Needs assistance   Sitting balance-Leahy Scale: Good     Standing balance support: During functional activity, No upper extremity supported Standing balance-Leahy Scale: Poor                               Pertinent Vitals/Pain Pain Assessment Pain Assessment: No/denies pain    Home Living Family/patient expects to be discharged to:: Private residence Living Arrangements: Alone Available Help at Discharge: Family Type of Home:  (shed in backyard)             Additional Comments: technically lives with parents and extended family but has tried to stay away as not to entangle his family in any of his legal issues and/or questionable social behaviors with substance abuse.  Prior Function Prior Level of Function : Independent/Modified Independent;Working/employed (does not have a liscence;)                     Extremity/Trunk Assessment   Upper Extremity Assessment Upper Extremity Assessment: RUE deficits/detail;LUE deficits/detail RUE Deficits / Details: on entry pt reports 3 hours Rt hand difficulty as follows: weakness of finger flexion digits 2 and 3 (with intermittent  cramping/locking); palmar paresthesia digits 2,3 palm to fingertips. LUE Deficits / Details: doing fine, no significant pain, fingers moving well            Communication      Cognition Arousal: Alert Behavior During Therapy: Anxious, Lability, Restless Overall Cognitive Status: Within Functional Limits for tasks assessed                                          General Comments      Exercises Other Exercises Other Exercises: forward/backward stepping at bedside Other Exercises: 90 degree turns at bedside Other Exercises: marching in place at bedside   Assessment/Plan    PT Assessment Patient needs continued PT services  PT Problem List Decreased strength;Decreased activity tolerance;Decreased balance;Decreased mobility;Decreased cognition;Decreased knowledge of use of DME;Decreased safety awareness       PT Treatment Interventions DME instruction;Gait training;Stair training;Functional mobility training;Therapeutic activities;Therapeutic exercise;Balance training;Neuromuscular re-education;Patient/family education    PT Goals (Current goals can be found in the Care Plan section)  Acute Rehab PT Goals Patient Stated Goal: Regain appetite, strength, balance PT Goal Formulation: With patient Time For Goal Achievement: 11/30/22 Potential to Achieve Goals: Good    Frequency Min 1X/week     Co-evaluation               AM-PAC PT "6 Clicks" Mobility  Outcome Measure Help needed turning from your back to your side while in a flat bed without using bedrails?: A Little Help needed moving from lying on your back to sitting on the side of a flat bed without using bedrails?: A Little Help needed moving to and from a bed to a chair (including a wheelchair)?: A Little Help needed standing up from a chair using your arms (e.g., wheelchair or bedside chair)?: A Little Help needed to walk in hospital room?: A Lot Help needed climbing 3-5 steps with a  railing? : A Lot 6 Click Score: 16    End of Session   Activity Tolerance: Patient limited by fatigue Patient left: in bed;with family/visitor present;with call bell/phone within reach Nurse Communication: Other (comment) (Rt hand issues reported) PT Visit Diagnosis: Unsteadiness on feet (R26.81);Other abnormalities of gait and mobility (R26.89);Muscle weakness (generalized) (M62.81);Difficulty in walking, not elsewhere classified (R26.2)    Time: 1914-7829 PT Time Calculation (min) (ACUTE ONLY): 24 min   Charges:   PT Evaluation $PT Eval Moderate Complexity: 1 Mod PT Treatments $Therapeutic Activity: 8-22 mins PT General Charges $$ ACUTE PT VISIT: 1 Visit        12:05 PM, 11/16/22 Rosamaria Lints, PT, DPT Physical Therapist - [facility]  [phone] (ASCOM)    Amani Marseille C 11/16/2022, 12:01 PM

## 2022-11-17 LAB — THYROID PANEL WITH TSH
Free Thyroxine Index: 4.3 (ref 1.2–4.9)
T3 Uptake Ratio: 27 % (ref 24–39)
T4, Total: 15.9 ug/dL — ABNORMAL HIGH (ref 4.5–12.0)
TSH: 0.263 u[IU]/mL — ABNORMAL LOW (ref 0.450–4.500)

## 2022-11-17 NOTE — Discharge Summary (Signed)
[facility] Discharge Summary    AMA (AGAINST MEDICAL ADVICE)   Patient: Jeffery Knight. FAO:[id number]  PCP: Patient, No Pcp Per  Date of admission: 11/15/2022   Date of discharge: 11/16/2022     Discharge Diagnoses:  Principal Problem:   Enteritis Active Problems:   Opioid abuse (HCC)   Palpitation   Abscess   Admitted From: Home Disposition:   AMA  Recommendations for Outpatient Follow-up:  PCP: left AMA Follow up LABS/TEST:  pt left AMA   Diet recommendation: left AMA  Activity: pt left AMA   Discharge Condition: Pt left AMA  Code Status: Full code   History of present illness: As per the H and P dictated on admission Hospital Course:  Jeffery Cockayne. is a 32 y.o. 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  Patient reported palpitations for past 1 month, increased fatigue but he has been going to the work.  Patient stopped using heroin/fentanyl 2 days ago, having diffuse abdominal discomfort, cramps, intractable nausea, vomiting and diarrhea, low appetite.  Patient reported left arm swelling and pain for past few days, and patient stopped using left forearm for IV drugs for approximately 5 days ago and he has been using right forearm since then.   ED workup: VS tachycardia heart rate 102--113, BP soft and stable, RR 13--28, afebrile 100% on room air Hypokalemia potassium 3.4, serum glucose 126, slightly elevated.  Troponin 91 Lactic acid 3.0, WBC 17.2 TSH 0.5, free T41.34 Elevated, PR NR, negative COVID, flu and RSV Negative C. difficile, GI pathogen negative UDS positive for opiates and THC [place] left arm:  Small abscess in the area of concern, measuring 2.7 by 1.1 by 2.0 cm. General surgery was consulted.  TRH was consulted for admission and further management as below   12/18/2022  I noticed today that this patient was missing from my rounding list, so I searched and found  that he left AMA yesterday on 11/27 around 7:45 PM.  I was not notified by anybody.   Assessment and Plan:   # Sepsis secondary to left forearm cellulitis/abscess Continue ceftriaxone and vancomycin, pharmacy consulted for dosing and drug monitoring General Surgery consulted, s/p I&D Lactic acid 3.0----0.9 resolved Blood culture NGTD Continue dressing changes as per ID Continue Tylenol and Toradol for now for pain control as per general surgery Follow fluid culture Trend WBC count   # Gastroenteritis could be secondary to THC abuse and opiate withdrawal symptoms Continue IV fluid for hydration Symptomatic treatment for nausea vomiting and diarrhea Replace electrolytes Continue PPI for GI prophylaxis   # Hypokalemia, potassium repleted. Monitor electrolytes and replete as needed.   # Hyperthyroidism TSH 0.5, free T4 level 1.34---1.41 elevated 10/27 started methimazole 15 mg p.o. daily Follow thyroid antibodies and thyroid sonogram Patient presented with palpitations TTE LVEF 6065%, no wall motion abnormality, no any other significant findings Recommend to follow-up with endocrinology as an outpatient for further management   # Opiate withdrawals Patient was using fentanyl IV and marijuana Watch for withdrawal symptoms Started Subutex 4 mg SL TID x 2 days, 2 mg TID x 2 days and 1 mg TID x 1 day to detox   # Marijuana abuse, drug abuse abstinence counseling done. TOC consulted for substance abuse counseling and resources.   Body mass index is 21.09 kg/m.  Nutrition Interventions:   Consultants: Orthopedic surgery Procedures: s/p I&D left forearm  Discharge Exam: Please see physical exam in the progress note done on 12/17/2022 Patient left AMA in the evening around 8 PM as per documentation by RN.  Filed Weights   11/15/22 0235  Weight: 66.7 kg   Vitals:   11/16/22 0830 11/16/22 1158  BP: (!) 114/59 (!) 127/92  Pulse: (!) 108 (!) 107  Resp: (!) 27 18  Temp: 99  F (37.2 C)   SpO2: 97% 100%    DISCHARGE MEDICATION: Allergies as of 11/16/2022       Reactions   Tramadol Itching        Medication List     ASK your doctor about these medications    HYDROcodone-acetaminophen 5-325 MG tablet Commonly known as: NORCO/VICODIN Take 1 tablet by mouth every 6 (six) hours as needed for moderate pain.   naproxen 500 MG tablet Commonly known as: Naprosyn Take 1 tablet (500 mg total) by mouth 2 (two) times daily with a meal.       Allergies  Allergen Reactions   Tramadol Itching     The results of significant diagnostics from this hospitalization (including imaging, microbiology, ancillary and laboratory) are listed below for reference.    Significant Diagnostic Studies: US THYROID  Result Date: 11/16/2022 CLINICAL DATA:  Hyperthyroidism EXAM: THYROID ULTRASOUND TECHNIQUE: Ultrasound examination of the thyroid gland and adjacent soft tissues was performed. COMPARISON:  None available. FINDINGS: Parenchymal Echotexture: Normal Isthmus: 0.3 cm Right lobe: 4.5 x 1.3 x 1.4 cm Left lobe: 4.5 x 1.2 x 1.3 cm _________________________________________________________ Estimated total number of nodules >/= 1 cm: 0 Number of spongiform nodules >/=  2 cm not described below (TR1): 0 Number of mixed cystic and solid nodules >/= 1.5 cm not described below (TR2): 0 _________________________________________________________ No discrete nodules are seen within the thyroid gland. IMPRESSION: Normal thyroid ultrasound The above is in keeping with the ACR TI-RADS recommendations - J Am Coll Radiol 2017;14:587-595. Electronically Signed   By: Acquanetta Belling M.D.   On: 11/16/2022 09:37   ECHOCARDIOGRAM COMPLETE  Result Date: 11/15/2022    ECHOCARDIOGRAM REPORT   Patient Name:   Jeffery Knight. Date of Exam: 11/15/2022 Medical Rec #:  [id number]           Height:       70.0 in Accession #:    [id number]          Weight:       147.0 lb Date of Birth:  06-06-1990            BSA:          1.831 m Patient Age:    32 years            BP:           129/72 mmHg Patient Gender: M                   HR:           106 bpm. Exam Location:  [facility] Procedure: 2D Echo Indications:     Endocarditis I38  History:         Patient has no prior history of Echocardiogram examinations.  Sonographer:     Elwin Sleight RDCS Referring Phys:  [id number] [facility] GOEL Diagnosing Phys: Julien Nordmann MD  Sonographer Comments: Suboptimal parasternal window, suboptimal apical window and suboptimal subcostal window. Image acquisition challenging due to respiratory motion. IMPRESSIONS  1. Left ventricular ejection fraction, by estimation, is 60 to 65%. The left ventricle has  normal function. The left ventricle has no regional wall motion abnormalities. Left ventricular diastolic parameters were normal.  2. Right ventricular systolic function is normal. The right ventricular size is normal. Tricuspid regurgitation signal is inadequate for assessing PA pressure.  3. The mitral valve is normal in structure. No evidence of mitral valve regurgitation. No evidence of mitral stenosis.  4. The aortic valve has an indeterminant number of cusps. Aortic valve regurgitation is not visualized. No aortic stenosis is present.  5. The inferior vena cava is normal in size with greater than 50% respiratory variability, suggesting right atrial pressure of 3 mmHg. FINDINGS  Left Ventricle: Left ventricular ejection fraction, by estimation, is 60 to 65%. The left ventricle has normal function. The left ventricle has no regional wall motion abnormalities. The left ventricular internal cavity size was normal in size. There is  no left ventricular hypertrophy. Left ventricular diastolic parameters were normal. Right Ventricle: The right ventricular size is normal. No increase in right ventricular wall thickness. Right ventricular systolic function is normal. Tricuspid regurgitation signal is inadequate for assessing PA pressure. Left Atrium:  Left atrial size was normal in size. Right Atrium: Right atrial size was normal in size. Pericardium: There is no evidence of pericardial effusion. Mitral Valve: The mitral valve is normal in structure. No evidence of mitral valve regurgitation. No evidence of mitral valve stenosis. Tricuspid Valve: The tricuspid valve is normal in structure. Tricuspid valve regurgitation is not demonstrated. No evidence of tricuspid stenosis. Aortic Valve: The aortic valve has an indeterminant number of cusps. Aortic valve regurgitation is not visualized. No aortic stenosis is present. Aortic valve peak gradient measures 12.7 mmHg. Pulmonic Valve: The pulmonic valve was normal in structure. Pulmonic valve regurgitation is not visualized. No evidence of pulmonic stenosis. Aorta: The aortic root is normal in size and structure. Venous: The inferior vena cava is normal in size with greater than 50% respiratory variability, suggesting right atrial pressure of 3 mmHg. IAS/Shunts: No atrial level shunt detected by color flow Doppler.  LEFT VENTRICLE PLAX 2D LVIDd:         3.90 cm   Diastology LVIDs:         2.10 cm   LV e' medial:    14.50 cm/s LV PW:         0.90 cm   LV E/e' medial:  5.8 LV IVS:        0.90 cm   LV e' lateral:   17.00 cm/s LVOT diam:     1.90 cm   LV E/e' lateral: 4.9 LV SV:         81 LV SV Index:   44 LVOT Area:     2.84 cm  RIGHT VENTRICLE RV Basal diam:  2.20 cm RV S prime:     24.60 cm/s TAPSE (M-mode): 2.6 cm LEFT ATRIUM             Index        RIGHT ATRIUM           Index LA diam:        2.90 cm 1.58 cm/m   RA Area:     16.20 cm LA Vol (A2C):   24.6 ml 13.43 ml/m  RA Volume:   44.40 ml  24.24 ml/m LA Vol (A4C):   27.7 ml 15.13 ml/m LA Biplane Vol: 26.9 ml 14.69 ml/m  AORTIC VALVE                  PULMONIC  VALVE AV Area (Vmax): 2.79 cm      PV Vmax:        1.75 m/s AV Vmax:        178.00 cm/s   PV Peak grad:   12.2 mmHg AV Peak Grad:   12.7 mmHg     RVOT Peak grad: 12 mmHg LVOT Vmax:      175.00 cm/s  LVOT Vmean:     125.000 cm/s LVOT VTI:       0.284 m  AORTA Ao Root diam: 3.20 cm MITRAL VALVE MV Area (PHT): 4.86 cm     SHUNTS MV Decel Time: 156 msec     Systemic VTI:  0.28 m MV E velocity: 83.90 cm/s   Systemic Diam: 1.90 cm MV A velocity: 104.00 cm/s MV E/A ratio:  0.81 Julien Nordmann MD Electronically signed by Julien Nordmann MD Signature Date/Time: 11/15/2022/6:37:03 PM    Final    [place] LT UPPER EXTREM LTD SOFT TISSUE NON VASCULAR  Result Date: 11/15/2022 CLINICAL DATA:  Abscess, IV drug abuse EXAM: ULTRASOUND LEFT UPPER EXTREMITY LIMITED TECHNIQUE: Ultrasound examination of the upper extremity soft tissues was performed in the area of clinical concern. COMPARISON:  None Available. FINDINGS: In the area of concern, a complex subcutaneous hypoechoic lesion along the superficial fascia margin measures 2.7 by 1.1 by 2.0 cm (volume = 3.1 cm^3). A small abscess is likely. IMPRESSION: 1. Small abscess in the area of concern, measuring 2.7 by 1.1 by 2.0 cm. Electronically Signed   By: Gaylyn Rong M.D.   On: 11/15/2022 10:04    Microbiology: Recent Results (from the past 240 hour(s))  Resp panel by RT-PCR (RSV, Flu A&B, Covid) Anterior Nasal Swab     Status: None   Collection Time: 11/15/22  2:49 AM   Specimen: Anterior Nasal Swab  Result Value Ref Range Status   SARS Coronavirus 2 by RT PCR NEGATIVE NEGATIVE Final    Comment: (NOTE) SARS-CoV-2 target nucleic acids are NOT DETECTED.  The SARS-CoV-2 RNA is generally detectable in upper respiratory specimens during the acute phase of infection. The lowest concentration of SARS-CoV-2 viral copies this assay can detect is 138 copies/mL. A negative result does not preclude SARS-Cov-2 infection and should not be used as the sole basis for treatment or other patient management decisions. A negative result may occur with  improper specimen collection/handling, submission of specimen other than nasopharyngeal swab, presence of viral mutation(s)  within the areas targeted by this assay, and inadequate number of viral copies(<138 copies/mL). A negative result must be combined with clinical observations, patient history, and epidemiological information. The expected result is Negative.  Fact Sheet for Patients:  BloggerCourse.com  Fact Sheet for Healthcare Providers:  SeriousBroker.it  This test is no t yet approved or cleared by the [place] FDA and  has been authorized for detection and/or diagnosis of SARS-CoV-2 by FDA under an Emergency Use Authorization (EUA). This EUA will remain  in effect (meaning this test can be used) for the duration of the COVID-19 declaration under Section 564(b)(1) of the Act, 21 U.S.C.section 360bbb-3(b)(1), unless the authorization is terminated  or revoked sooner.       Influenza A by PCR NEGATIVE NEGATIVE Final   Influenza B by PCR NEGATIVE NEGATIVE Final    Comment: (NOTE) The Xpert Xpress SARS-CoV-2/FLU/RSV plus assay is intended as an aid in the diagnosis of influenza from Nasopharyngeal swab specimens and should not be used as a sole basis for treatment. Nasal washings and aspirates  are unacceptable for Xpert Xpress SARS-CoV-2/FLU/RSV testing.  Fact Sheet for Patients: BloggerCourse.com  Fact Sheet for Healthcare Providers: SeriousBroker.it  This test is not yet approved or cleared by the [place] FDA and has been authorized for detection and/or diagnosis of SARS-CoV-2 by FDA under an Emergency Use Authorization (EUA). This EUA will remain in effect (meaning this test can be used) for the duration of the COVID-19 declaration under Section 564(b)(1) of the Act, 21 U.S.C. section 360bbb-3(b)(1), unless the authorization is terminated or revoked.     Resp Syncytial Virus by PCR NEGATIVE NEGATIVE Final    Comment: (NOTE) Fact Sheet for  Patients: BloggerCourse.com  Fact Sheet for Healthcare Providers: SeriousBroker.it  This test is not yet approved or cleared by the [place] FDA and has been authorized for detection and/or diagnosis of SARS-CoV-2 by FDA under an Emergency Use Authorization (EUA). This EUA will remain in effect (meaning this test can be used) for the duration of the COVID-19 declaration under Section 564(b)(1) of the Act, 21 U.S.C. section 360bbb-3(b)(1), unless the authorization is terminated or revoked.  Performed at [facility], [address] [place], [place] [address]   Blood Culture (routine x 2)     Status: None (Preliminary result)   Collection Time: 11/15/22  4:55 AM   Specimen: BLOOD  Result Value Ref Range Status   Specimen Description BLOOD RIGHT ANTECUBITAL  Final   Special Requests   Final    BOTTLES DRAWN AEROBIC AND ANAEROBIC Blood Culture adequate volume   Culture   Final    NO GROWTH 1 DAY Performed at [facility], [address], [place], [place] [address]    Report Status PENDING  Incomplete  Blood Culture (routine x 2)     Status: None (Preliminary result)   Collection Time: 11/15/22  4:55 AM   Specimen: BLOOD  Result Value Ref Range Status   Specimen Description BLOOD LEFT ANTECUBITAL  Final   Special Requests   Final    BOTTLES DRAWN AEROBIC AND ANAEROBIC Blood Culture adequate volume   Culture   Final    NO GROWTH 1 DAY Performed at [facility], [address] [place], [place] [address]    Report Status PENDING  Incomplete  C Difficile Quick Screen w PCR reflex     Status: None   Collection Time: 11/15/22 12:47 PM   Specimen: STOOL  Result Value Ref Range Status   C Diff antigen NEGATIVE NEGATIVE Final   C Diff toxin NEGATIVE NEGATIVE Final   C Diff interpretation No C. difficile detected.  Final    Comment: Performed at [facility], [address]  [place], [place] [address]  Gastrointestinal Panel by PCR , Stool     Status: None   Collection Time: 11/15/22 12:48 PM   Specimen: STOOL  Result Value Ref Range Status   Campylobacter species NOT DETECTED NOT DETECTED Final   Plesimonas shigelloides NOT DETECTED NOT DETECTED Final   Salmonella species NOT DETECTED NOT DETECTED Final   Yersinia enterocolitica NOT DETECTED NOT DETECTED Final   Vibrio species NOT DETECTED NOT DETECTED Final   Vibrio cholerae NOT DETECTED NOT DETECTED Final   Enteroaggregative E coli (EAEC) NOT DETECTED NOT DETECTED Final   Enteropathogenic E coli (EPEC) NOT DETECTED NOT DETECTED Final   Enterotoxigenic E coli (ETEC) NOT DETECTED NOT DETECTED Final   Shiga like toxin producing E coli (STEC) NOT DETECTED NOT DETECTED Final   Shigella/Enteroinvasive E coli (EIEC) NOT DETECTED  NOT DETECTED Final   Cryptosporidium NOT DETECTED NOT DETECTED Final   Cyclospora cayetanensis NOT DETECTED NOT DETECTED Final   Entamoeba histolytica NOT DETECTED NOT DETECTED Final   Giardia lamblia NOT DETECTED NOT DETECTED Final   Adenovirus F40/41 NOT DETECTED NOT DETECTED Final   Astrovirus NOT DETECTED NOT DETECTED Final   Norovirus GI/GII NOT DETECTED NOT DETECTED Final   Rotavirus A NOT DETECTED NOT DETECTED Final   Sapovirus (I, II, IV, and V) NOT DETECTED NOT DETECTED Final    Comment: Performed at [facility], [address] [place], [place] [address]  Aerobic/Anaerobic Culture w Gram Stain (surgical/deep wound)     Status: None (Preliminary result)   Collection Time: 11/15/22  5:11 PM   Specimen: Wound; Abscess  Result Value Ref Range Status   Specimen Description ABSCESS LEFT FOREARM  Final   Special Requests ID A  Final   Gram Stain   Final    RARE WBC PRESENT, PREDOMINANTLY MONONUCLEAR FEW GRAM POSITIVE COCCI IN PAIRS IN SINGLES Performed at [facility], [address] [place], [place] [address]    Culture PENDING  Incomplete   Report Status PENDING   Incomplete     Labs: CBC: Recent Labs  Lab 11/15/22 0249 11/16/22 0503  WBC 17.2* 22.7*  HGB 16.1 12.8*  HCT 46.9 35.8*  MCV 83.2 81.4  PLT 362 330   Basic Metabolic Panel: Recent Labs  Lab 11/15/22 0249 11/16/22 0850  NA 137 140  K 3.4* 2.9*  CL 101 110  CO2 25 21*  GLUCOSE 126* 106*  BUN 8 7  CREATININE 0.78 0.77  CALCIUM 9.3 8.7*  MG  --  1.7  PHOS  --  2.9   Liver Function Tests: Recent Labs  Lab 11/15/22 0249  AST 26  ALT 27  ALKPHOS 76  BILITOT 1.0  PROT 8.4*  ALBUMIN 4.1   Recent Labs  Lab 11/15/22 0249  LIPASE 24   No results for input(s): "AMMONIA" in the last 168 hours. Cardiac Enzymes: No results for input(s): "CKTOTAL", "CKMB", "CKMBINDEX", "TROPONINI" in the last 168 hours. BNP (last 3 results) No results for input(s): "BNP" in the last 8760 hours. CBG: No results for input(s): "GLUCAP" in the last 168 hours.  Time spent: zero minutes  Signed:  Gillis Santa  [facility] 11/17/2022 7:45 AM

## 2022-11-18 LAB — HCV RT-PCR, QUANT (NON-GRAPH)
HCV log10: 6.248 {Log}
Hepatitis C Quantitation: 1770000 [IU]/mL

## 2022-11-18 LAB — THYROID ANTIBODIES (THYROPEROXIDASE & THYROGLOBULIN): Thyroglobulin Antibody: <1 [IU]/mL (ref 0.0–0.9)

## 2022-11-18 LAB — HCV AB W REFLEX TO QUANT PCR: HCV Ab: REACTIVE — AB

## 2022-11-18 LAB — THYROID ANTIBODIES: Thyroperoxidase Ab SerPl-aCnc: 19 [IU]/mL (ref 0–34)

## 2022-11-20 LAB — CULTURE, BLOOD (ROUTINE X 2)
Culture: NO GROWTH
Culture: NO GROWTH
Special Requests: ADEQUATE
Special Requests: ADEQUATE

## 2022-11-21 LAB — AEROBIC/ANAEROBIC CULTURE W GRAM STAIN (SURGICAL/DEEP WOUND)

## 2022-11-25 ENCOUNTER — Emergency Department
Admission: EM | Admit: 2022-11-25 | Discharge: 2022-11-25 | Disposition: A | Discharge status: Home / Self Care | Payer: 59 | Attending: Emergency Medicine | Admitting: Emergency Medicine

## 2022-11-25 ENCOUNTER — Emergency Department: Payer: 59

## 2022-11-25 ENCOUNTER — Other Ambulatory Visit: Payer: Self-pay

## 2022-11-25 DIAGNOSIS — R0789 Other chest pain: Secondary | ICD-10-CM | POA: Diagnosis present

## 2022-11-25 DIAGNOSIS — F17210 Nicotine dependence, cigarettes, uncomplicated: Secondary | ICD-10-CM | POA: Insufficient documentation

## 2022-11-25 DIAGNOSIS — Z4889 Encounter for other specified surgical aftercare: Secondary | ICD-10-CM | POA: Insufficient documentation

## 2022-11-25 DIAGNOSIS — Z4802 Encounter for removal of sutures: Secondary | ICD-10-CM | POA: Diagnosis not present

## 2022-11-25 LAB — CBC
HCT: 44.9 % (ref 39.0–52.0)
Hemoglobin: 15.3 g/dL (ref 13.0–17.0)
MCH: 28.7 pg (ref 26.0–34.0)
MCHC: 34.1 g/dL (ref 30.0–36.0)
MCV: 84.2 fL (ref 80.0–100.0)
Platelets: 365 10*3/uL (ref 150–400)
RBC: 5.33 MIL/uL (ref 4.22–5.81)
RDW: 12.9 % (ref 11.5–15.5)
WBC: 10.6 10*3/uL — ABNORMAL HIGH (ref 4.0–10.5)
nRBC: 0 % (ref 0.0–0.2)

## 2022-11-25 LAB — BASIC METABOLIC PANEL
Anion gap: 9 (ref 5–15)
BUN: 12 mg/dL (ref 6–20)
CO2: 27 mmol/L (ref 22–32)
Calcium: 9.5 mg/dL (ref 8.9–10.3)
Chloride: 99 mmol/L (ref 98–111)
Creatinine, Ser: 0.82 mg/dL (ref 0.61–1.24)
GFR, Estimated: >60 mL/min (ref >60)
Glucose, Bld: 110 mg/dL — ABNORMAL HIGH (ref 70–99)
Potassium: 4.2 mmol/L (ref 3.5–5.1)
Sodium: 135 mmol/L (ref 135–145)

## 2022-11-25 LAB — TROPONIN I (HIGH SENSITIVITY): Troponin I (High Sensitivity): 4 ng/L (ref <18)

## 2022-11-25 NOTE — ED Provider Notes (Signed)
[facility] Provider Note    None    (approximate)   History   Chest Pain and Wound Infection (Left forearm)   HPI  Jeffery Knight. is a 32 y.o. male with PMH of IV opioid abuse who presents for evaluation of chest pain and a wound check.  Patient was admitted to the hospital on 10/26 for left forearm abscess and sepsis.  He had an I&D by Dr. Joice Lofts that same day, where drain was placed.  He left AMA on 10/27.  Today patient reports some mild chest pain, he states this started last night but got worse in the car ride on the way here.  He feels it is related to a panic attack or anxiety as he was really worked up when he is having the chest pain.  He also would like his wound checked.  He denies any shortness of breath, pain at the wound site, drainage from the wound and fevers.  He endorses IV opioid use but denies cocaine or methamphetamine use.      Physical Exam   Triage Vital Signs: ED Triage Vitals  Encounter Vitals Group     BP 11/25/22 1326 (!) 150/88     Systolic BP Percentile --      Diastolic BP Percentile --      Pulse Rate 11/25/22 1326 93     Resp 11/25/22 1326 18     Temp 11/25/22 1326 97.6 F (36.4 C)     Temp Source 11/25/22 1326 Oral     SpO2 11/25/22 1326 99 %     Weight 11/25/22 1327 147 lb (66.7 kg)     Height 11/25/22 1327 5\' 10"  (1.778 m)     Head Circumference --      Peak Flow --      Pain Score 11/25/22 1327 5     Pain Loc --      Pain Education --      Exclude from Growth Chart --     Most recent vital signs: Vitals:   11/25/22 1326  BP: (!) 150/88  Pulse: 93  Resp: 18  Temp: 97.6 F (36.4 C)  SpO2: 99%   General: Awake, no distress.  CV:  Good peripheral perfusion.  RRR. Resp:  Normal effort.  CTAB. Abd:  No distention.  Other:  Well-healing wound on the left anterior forearm, sutures are intact and drain is in place.  No drainage from the wound, no surrounding erythema or induration, no tenderness to  palpation.   ED Results / Procedures / Treatments   Labs (all labs ordered are listed, but only abnormal results are displayed) Labs Reviewed  BASIC METABOLIC PANEL - Abnormal; Notable for the following components:      Result Value   Glucose, Bld 110 (*)    All other components within normal limits  CBC - Abnormal; Notable for the following components:   WBC 10.6 (*)    All other components within normal limits  TROPONIN I (HIGH SENSITIVITY)     EKG  ED provider EKG interpretation: Normal sinus rhythm with no ST changes.  Vent. rate 93 BPM PR interval 148 ms QRS duration 92 ms QT/QTcB 354/440 ms P-R-T axes 60 92 47  RADIOLOGY  Chest x-ray obtained, I interpreted the images as well as reviewed the radiologist report which is negative for any acute cardiopulmonary abnormalities.   PROCEDURES:  Critical Care performed: No  Procedures   MEDICATIONS ORDERED IN ED: Medications -  No data to display   IMPRESSION / MDM / ASSESSMENT AND PLAN / ED COURSE  I reviewed the triage vital signs and the nursing notes.                             32 year old male presents for evaluation of chest pain and a wound check.  Patient was hypertensive in triage otherwise vital signs are stable.  Patient NAD on exam.  Differential diagnosis includes, but is not limited to, ACS, endocarditis, pulmonary embolism, anxiety, wound infection.  Patient's presentation is most consistent with acute presentation with potential threat to life or bodily function.  BMP and CBC are both unremarkable.  Troponin is not elevated.  I did not obtain a second troponin as patient reported his chest pain began last night as we are outside of the 4-hour window.  Chest x-ray obtained, I interpreted the images as well as reviewed the radiologist report which was negative for any acute cardiopulmonary abnormalities.  EKG showed normal sinus rhythm with no ST changes.  My suspicion for ACS is low given  findings on EKG and the troponin not being elevated.  Based on the [facility] rule I can rule out pulmonary embolism.  I believe patient's chest pain is most likely related to anxiety as patient describes feeling worked up at the time and he has reassuring findings on physical exam and blood work.  We discussed ways to manage anxiety and I will also give him a referral for primary care provider.  Patient's wound site from the I&D appears to be healing well.  Dr. Joice Lofts came and evaluated the patient while he was in the ED, he advised me to pull the drain and remove the sutures.  Patient can follow-up in a week in the office for another wound check.  All questions were answered and patient was stable at discharge.   FINAL CLINICAL IMPRESSION(S) / ED DIAGNOSES   Final diagnoses:  Other chest pain  Encounter for post surgical wound check  Visit for suture removal     Rx / DC Orders   ED Discharge Orders          Ordered    Ambulatory Referral to Primary Care (Establish Care)        11/25/22 1740             Note:  This document was prepared using Dragon voice recognition software and may include unintentional dictation errors.   Cameron Ali, PA-C 11/25/22 1740    Chesley Noon, MD 11/25/22 2328

## 2022-11-25 NOTE — ED Triage Notes (Signed)
Pt reports:  Chest discomfort Started yesterday "I believe it was a panic attack." Pressure feeling Comes and goes Left Arm infection Wants it to be evaluated Has a "drain in it." Seen a few night ago for it but left earlier

## 2022-11-25 NOTE — ED Notes (Addendum)
See triage note  Presents with some chest discomfort  States sx;stated yesterday  It comes and goes  Thinks he might be having a panic attack    He also wants a wound check  States he was admitted for arm abscess after using IV drugs

## 2022-11-25 NOTE — Discharge Instructions (Addendum)
Your blood work was normal, your EKG was normal and your chest xray was normal.   Please call Dr. Binnie Rail office next week. Information is attached, call the office and schedule an appointment with his PA [place].   Leave the dressing applied today in place for 24 hours, after that you can remove it. Clean with soap and water daily and cover with a bandage.   Please go to the following website to schedule new (and existing) patient appointments:   [email or web address]   The following is a list of primary care offices in the area who are accepting new patients at this time.  Please reach out to one of them directly and let them know you would like to schedule an appointment to follow up on an Emergency Department visit, and/or to establish a new primary care provider (PCP).  There are likely other primary care clinics in the are who are accepting new patients, but this is an excellent place to start:  [facility] Lead physician: Dr Shirlee Latch [address] [place], [place] [address] [phone]  [facility] Lead Physician: Dr Alba Cory [address], [place], [place] [address] [phone]  [facility]  Lead Physician: Dr Olevia Perches [address] [place], [place] [address] [phone]  [facility] Lead Physician: Dr Sofie Hartigan [address] [place], [place] [address] [phone]  [facility] at [facility] Lead Physician: Dr Bari Edward [address] [place], [place], [place] [address] [phone]

## 2022-11-25 NOTE — Consult Note (Signed)
ORTHOPAEDIC CONSULTATION  REQUESTING PHYSICIAN: Chesley Noon, MD  Chief Complaint:   Left volar forearm wound  History of Present Illness: Jeffery Knight. is a 32 y.o. male with a history of IV drug abuse who is now 10 days status post formal irrigation and debridement of a left volar forearm abscess resulting from an infected IV injection site.  The patient signed himself out AMA before his Penrose drain could be removed.  He was scheduled to follow-up in my office for suture removal earlier today but instead came to the emergency room for evaluation of chest pain which he feels may have been due to anxiety as his workup is negative for any cardiac issues.  Well in the emergency room, I been asked to evaluate the patient to see if his wound has healed sufficiently for drain and suture removal.  The patient denies any pain pertaining to his wound.  He has not noted any drainage, swelling, or erythema along the volar forearm, and denies any numbness or paresthesias to his hand.  History reviewed. No pertinent past medical history. Past Surgical History:  Procedure Laterality Date   I & D EXTREMITY Left 11/15/2022   Procedure: IRRIGATION AND DEBRIDEMENT LEFT FOREARM;  Surgeon: Christena Flake, MD;  Location: [facility] ORS;  Service: Orthopedics;  Laterality: Left;   left hand surgery      Social History   Socioeconomic History   Marital status: Single    Spouse name: Not on file   Number of children: Not on file   Years of education: Not on file   Highest education level: Not on file  Occupational History   Not on file  Tobacco Use   Smoking status: Every Day    Current packs/day: 0.50    Types: Cigarettes   Smokeless tobacco: Never  Vaping Use   Vaping status: Never Used  Substance and Sexual Activity   Alcohol use: No   Drug use: Yes    Types: Marijuana   Sexual activity: Not on file  Other Topics Concern   Not on  file  Social History Narrative   Not on file   Social Determinants of Health   Financial Resource Strain: Not on file  Food Insecurity: No Food Insecurity (11/16/2022)   Hunger Vital Sign    Worried About Running Out of Food in the Last Year: Never true    Ran Out of Food in the Last Year: Never true  Transportation Needs: No Transportation Needs (11/16/2022)   PRAPARE - Administrator, Civil Service (Medical): No    Lack of Transportation (Non-Medical): No  Physical Activity: Not on file  Stress: Not on file  Social Connections: Not on file   History reviewed. No pertinent family history. Allergies  Allergen Reactions   Tramadol Itching   Prior to Admission medications   Medication Sig Start Date End Date Taking? Authorizing Provider  HYDROcodone-acetaminophen (NORCO/VICODIN) 5-325 MG tablet Take 1 tablet by mouth every 6 (six) hours as needed for moderate pain. Patient not taking: Reported on 11/15/2022 02/10/19   Tommi Rumps, PA-C  naproxen (NAPROSYN) 500 MG tablet Take 1 tablet (500 mg total) by mouth 2 (two) times daily with a meal. Patient not taking: Reported on 11/15/2022 02/10/19   Tommi Rumps, PA-C   DG Chest 2 View  Result Date: 11/25/2022 CLINICAL DATA:  Chest pain EXAM: CHEST - 2 VIEW COMPARISON:  11/02/2018 FINDINGS: Cardiac and mediastinal contours are within normal limits. No focal  pulmonary opacity. No pleural effusion or pneumothorax. No acute osseous abnormality. IMPRESSION: No acute cardiopulmonary process. Electronically Signed   By: Wiliam Ke M.D.   On: 11/25/2022 15:44    Positive ROS: All other systems have been reviewed and were otherwise negative with the exception of those mentioned in the HPI and as above.  Physical Exam: General:  Alert, no acute distress Psychiatric:  Patient is competent for consent with normal mood and affect   Cardiovascular:  No pedal edema Respiratory:  No wheezing, non-labored breathing GI:   Abdomen is soft and non-tender Skin:  No lesions in the area of chief complaint Neurologic:  Sensation intact distally Lymphatic:  No axillary or cervical lymphadenopathy  Orthopedic Exam:  Orthopedic examination is limited to the left forearm.  The volar forearm wound appears to be well-healed and without evidence for infection.  There is no swelling, erythema, ecchymosis, abrasions, or other skin abnormalities are identified.  The Penrose drain remains in place but there is no drainage from it.  The sutures also are intact.  He has no tenderness to palpation along the volar forearm.  He is able to active flex and extend his wrist fully without any pain or catching.  Is able to active flex and extend all digits fully without any pain or triggering.  He is neurovascularly intact to the left forearm and hand.  Assessment: Status post I&D of left volar forearm abscess.  Plan: The treatment options have been discussed with the patient.  I feel that the Penrose drain can be pulled and the sutures removed at this time.  The ER provider have said that she will do this for me.    The patient will have a fresh sterile dressing applied over the wound which will need to remain intact for the next several days before he thinks about showering or getting the area wet.  Please make arrangements for the patient to follow-up with my physician assistant Horris Latino, PA-C, in 1 week.    Thank you for your assistance in the care of this most pleasant yet unfortunate young man.   Maryagnes Amos, MD  Beeper #:  [phone]  11/25/2022 5:43 PM

## 2023-12-01 ENCOUNTER — Other Ambulatory Visit: Payer: Self-pay

## 2023-12-01 ENCOUNTER — Emergency Department

## 2023-12-01 ENCOUNTER — Observation Stay
Admission: EM | Admit: 2023-12-01 | Discharge: 2023-12-02 | Disposition: A | Discharge status: Home / Self Care | Attending: Osteopathic Medicine | Admitting: Osteopathic Medicine

## 2023-12-01 DIAGNOSIS — D72829 Elevated white blood cell count, unspecified: Secondary | ICD-10-CM | POA: Diagnosis not present

## 2023-12-01 DIAGNOSIS — F1990 Other psychoactive substance use, unspecified, uncomplicated: Secondary | ICD-10-CM | POA: Insufficient documentation

## 2023-12-01 DIAGNOSIS — F1193 Opioid use, unspecified with withdrawal: Secondary | ICD-10-CM | POA: Diagnosis not present

## 2023-12-01 DIAGNOSIS — R519 Headache, unspecified: Secondary | ICD-10-CM | POA: Diagnosis present

## 2023-12-01 DIAGNOSIS — A419 Sepsis, unspecified organism: Secondary | ICD-10-CM | POA: Diagnosis not present

## 2023-12-01 DIAGNOSIS — M545 Low back pain, unspecified: Secondary | ICD-10-CM | POA: Diagnosis not present

## 2023-12-01 DIAGNOSIS — F1910 Other psychoactive substance abuse, uncomplicated: Secondary | ICD-10-CM | POA: Insufficient documentation

## 2023-12-01 DIAGNOSIS — Z8614 Personal history of Methicillin resistant Staphylococcus aureus infection: Secondary | ICD-10-CM

## 2023-12-01 LAB — CBC WITH DIFFERENTIAL/PLATELET
Abs Immature Granulocytes: 0.11 K/uL — ABNORMAL HIGH (ref 0.00–0.07)
Basophils Absolute: 0 K/uL (ref 0.0–0.1)
Basophils Relative: 0 %
Eosinophils Absolute: 0 K/uL (ref 0.0–0.5)
Eosinophils Relative: 0 %
HCT: 47.2 % (ref 39.0–52.0)
Hemoglobin: 16.4 g/dL (ref 13.0–17.0)
Immature Granulocytes: 1 %
Lymphocytes Relative: 8 %
Lymphs Abs: 1.6 K/uL (ref 0.7–4.0)
MCH: 30.3 pg (ref 26.0–34.0)
MCHC: 34.7 g/dL (ref 30.0–36.0)
MCV: 87.1 fL (ref 80.0–100.0)
Monocytes Absolute: 0.7 K/uL (ref 0.1–1.0)
Monocytes Relative: 4 %
Neutro Abs: 17.2 K/uL — ABNORMAL HIGH (ref 1.7–7.7)
Neutrophils Relative %: 87 %
Platelets: 332 K/uL (ref 150–400)
RBC: 5.42 MIL/uL (ref 4.22–5.81)
RDW: 13.3 % (ref 11.5–15.5)
WBC: 19.7 K/uL — ABNORMAL HIGH (ref 4.0–10.5)
nRBC: 0 % (ref 0.0–0.2)

## 2023-12-01 LAB — COMPREHENSIVE METABOLIC PANEL WITH GFR
ALT: 24 U/L (ref 0–44)
AST: 33 U/L (ref 15–41)
Albumin: 5.4 g/dL — ABNORMAL HIGH (ref 3.5–5.0)
Alkaline Phosphatase: 79 U/L (ref 38–126)
Anion gap: 25 — ABNORMAL HIGH (ref 5–15)
BUN: 12 mg/dL (ref 6–20)
CO2: 19 mmol/L — ABNORMAL LOW (ref 22–32)
Calcium: 10.9 mg/dL — ABNORMAL HIGH (ref 8.9–10.3)
Chloride: 102 mmol/L (ref 98–111)
Creatinine, Ser: 0.99 mg/dL (ref 0.61–1.24)
GFR, Estimated: >60 mL/min (ref >60)
Glucose, Bld: 162 mg/dL — ABNORMAL HIGH (ref 70–99)
Potassium: 3.7 mmol/L (ref 3.5–5.1)
Sodium: 146 mmol/L — ABNORMAL HIGH (ref 135–145)
Total Bilirubin: 0.9 mg/dL (ref 0.0–1.2)
Total Protein: 8.7 g/dL — ABNORMAL HIGH (ref 6.5–8.1)

## 2023-12-01 LAB — CK: Total CK: 91 U/L (ref 49–397)

## 2023-12-01 LAB — LACTIC ACID, PLASMA
Lactic Acid, Venous: 7.4 mmol/L (ref 0.5–1.9)
Lactic Acid, Venous: 3.1 mmol/L (ref 0.5–1.9)

## 2023-12-01 MED ORDER — SODIUM CHLORIDE 0.9 % IV BOLUS
1000.0000 mL | Freq: Once | INTRAVENOUS | Status: AC
  Administered 2023-12-01: 1000 mL via INTRAVENOUS

## 2023-12-01 MED ORDER — VANCOMYCIN HCL 1500 MG/300ML IV SOLN
1500.0000 mg | Freq: Once | INTRAVENOUS | Status: AC
  Administered 2023-12-01: 1500 mg via INTRAVENOUS
  Filled 2023-12-01: qty 300

## 2023-12-01 MED ORDER — ACETAMINOPHEN 650 MG RE SUPP
650.0000 mg | Freq: Four times a day (QID) | RECTAL | Status: DC | PRN
Start: 2023-12-01 — End: 2023-12-02

## 2023-12-01 MED ORDER — PIPERACILLIN-TAZOBACTAM 3.375 G IVPB 30 MIN
3.3750 g | Freq: Once | INTRAVENOUS | Status: AC
  Administered 2023-12-01: 3.375 g via INTRAVENOUS
  Filled 2023-12-01: qty 50

## 2023-12-01 MED ORDER — MORPHINE SULFATE (PF) 4 MG/ML IV SOLN
4.0000 mg | Freq: Once | INTRAVENOUS | Status: AC
  Administered 2023-12-01: 4 mg via INTRAVENOUS
  Filled 2023-12-01: qty 1

## 2023-12-01 MED ORDER — KETOROLAC TROMETHAMINE 30 MG/ML IJ SOLN
30.0000 mg | Freq: Once | INTRAMUSCULAR | Status: AC
  Administered 2023-12-01: 30 mg via INTRAMUSCULAR
  Filled 2023-12-01: qty 1

## 2023-12-01 MED ORDER — ACETAMINOPHEN 325 MG PO TABS: 650.0000 mg | ORAL_TABLET | Freq: Four times a day (QID) | ORAL | Status: DC | PRN

## 2023-12-01 MED ORDER — LORAZEPAM 2 MG/ML IJ SOLN
1.0000 mg | INTRAMUSCULAR | Status: DC | PRN
  Administered 2023-12-02: 1 mg via INTRAVENOUS
  Filled 2023-12-01: qty 1

## 2023-12-01 MED ORDER — HYDROMORPHONE HCL 1 MG/ML IJ SOLN
1.0000 mg | INTRAMUSCULAR | Status: DC | PRN
  Administered 2023-12-02 (×2): 1 mg via INTRAVENOUS
  Filled 2023-12-01 (×2): qty 1

## 2023-12-01 MED ORDER — PIPERACILLIN-TAZOBACTAM 3.375 G IVPB
3.3750 g | Freq: Three times a day (TID) | INTRAVENOUS | Status: DC
  Administered 2023-12-01 – 2023-12-02 (×2): 3.375 g via INTRAVENOUS
  Filled 2023-12-01 (×2): qty 50

## 2023-12-01 MED ORDER — KETOROLAC TROMETHAMINE 30 MG/ML IJ SOLN: 30.0000 mg | Freq: Four times a day (QID) | INTRAMUSCULAR | Status: DC | PRN

## 2023-12-01 MED ORDER — HYDROMORPHONE HCL 1 MG/ML IJ SOLN
1.0000 mg | Freq: Once | INTRAMUSCULAR | Status: AC
  Administered 2023-12-01: 1 mg via INTRAVENOUS
  Filled 2023-12-01: qty 1

## 2023-12-01 MED ORDER — SODIUM CHLORIDE 0.9 % IV BOLUS
2000.0000 mL | Freq: Once | INTRAVENOUS | Status: AC
  Administered 2023-12-01: 2000 mL via INTRAVENOUS

## 2023-12-01 MED ORDER — IOHEXOL 300 MG/ML  SOLN
100.0000 mL | Freq: Once | INTRAMUSCULAR | Status: AC | PRN
  Administered 2023-12-01: 100 mL via INTRAVENOUS

## 2023-12-01 MED ORDER — ACETAMINOPHEN 500 MG PO TABS
1000.0000 mg | ORAL_TABLET | Freq: Once | ORAL | Status: AC
  Administered 2023-12-01: 1000 mg via ORAL
  Filled 2023-12-01: qty 2

## 2023-12-01 MED ORDER — LACTATED RINGERS IV SOLN
150.0000 mL/h | INTRAVENOUS | Status: DC
  Administered 2023-12-02: 150 mL/h via INTRAVENOUS

## 2023-12-01 MED ORDER — CLONIDINE HCL 0.1 MG PO TABS
0.1000 mg | ORAL_TABLET | Freq: Three times a day (TID) | ORAL | Status: AC
  Administered 2023-12-01 – 2023-12-02 (×2): 0.1 mg via ORAL
  Filled 2023-12-01 (×2): qty 1

## 2023-12-01 MED ORDER — OXYCODONE HCL 5 MG PO TABS: 5.0000 mg | ORAL_TABLET | ORAL | Status: DC | PRN

## 2023-12-01 NOTE — ED Notes (Signed)
 Spoke with CCMD about central monitoring at this time

## 2023-12-01 NOTE — ED Notes (Addendum)
 Blood cx drawn by megan rn and sent by this rn.  One cx had one bottle only per megan rn.  Repeat lactic acid sent also.  Pt restless and unable to lie still on stretcher moving about almost pulling iv out.  Iv re-taped again by this rn   Pt asking for food and drinks.  Pt informed he is NPO at this time.  Monitor leads and blood pressure cuff off of pt and reapplied again by this rn..  Pt taking equipment off.   Family states she disconnected equipment for pt to get up.  Call bell at bedside and instructed pt to use.

## 2023-12-01 NOTE — Assessment & Plan Note (Addendum)
 History of IV drug use  History of buprenorphine  intolerance Patient tachycardic to the 130s, tachypneic to the 20s, tremulous, taking all of his lines As tolerated clonidine,  As needed benzos, antiemetics Sitter if needed

## 2023-12-01 NOTE — Assessment & Plan Note (Addendum)
#   Acute back pain, suspect discitis # History of MRSA abscess left forearm 10/2022 with I&D, incomplete antibiotics # History of IVDU Sepsis criteria fever, tachycardia and tachypnea, leukocytosis and lactic acidosis CT lumbar spine with contrast showed no acute findings Will order contrasted MRI to evaluate further for discitis--once able to get contrast IR consult based on MRI findings Pain control Continue sepsis fluids Continue Zosyn and vancomycin  Follow cultures Can consider ID consult

## 2023-12-01 NOTE — Consult Note (Signed)
 ED Pharmacy Antibiotic Sign Off An antibiotic consult was received from an ED provider for sepsis per pharmacy dosing for vancomycin . A chart review was completed to assess appropriateness.   The following one time order(s) were placed:  Vancomycin  1500 mg x1  Further antibiotic and/or antibiotic pharmacy consults should be ordered by the admitting provider if indicated.   Thank you for allowing pharmacy to be a part of this patient's care.   Annabella LOISE Banks, [facility]  Clinical Pharmacist 12/01/23 4:08 PM

## 2023-12-01 NOTE — ED Provider Notes (Signed)
 [facility] Provider Note    Event Date/Time   First MD Initiated Contact with Patient 12/01/23 1533     (approximate)   History   Headache and Emesis  Pt had severe headache and started vomiting this morning. Pt only has chronic back pain at this moment. Pt is shaky in triage.    HPI Jeffery Knight. is a 33 y.o. male PMH chronic fentanyl  and heroin use presents for evaluation of headache - Patient states she has been feeling vaguely under the weather over the past few days.  Yesterday developed some headache and low back pain.  Headache has resolved but continues to have severe low back pain.  No neck pain or stiffness.  No rashes. - No urinary symptoms.  No preceding trauma.  Not on blood thinners.  States last IVDU was about 1 year ago, does actively use heroin and fentanyl , last use yesterday. - Did vomit twice today, no diarrhea.  Denies any abdominal pain.  No chest pain, cough, shortness of breath.   - No lower extremity weakness or numbness, no saddle anesthesia, no urinary or fecal incontinence or retention   Per chart review, last admitted October of last year for sepsis secondary to left forearm cellulitis     Physical Exam   Triage Vital Signs: ED Triage Vitals  Encounter Vitals Group     BP 12/01/23 1406 (!) 164/98     Girls Systolic BP Percentile --      Girls Diastolic BP Percentile --      Boys Systolic BP Percentile --      Boys Diastolic BP Percentile --      Pulse Rate 12/01/23 1405 (!) 132     Resp 12/01/23 1405 18     Temp 12/01/23 1404 97.8 F (36.6 C)     Temp src --      SpO2 12/01/23 1405 99 %     Weight 12/01/23 1407 140 lb (63.5 kg)     Height 12/01/23 1407 5' 10 (1.778 m)     Head Circumference --      Peak Flow --      Pain Score 12/01/23 1404 9     Pain Loc --      Pain Education --      Exclude from Growth Chart --     Most recent vital signs: Vitals:   12/01/23 2230 12/01/23 2239  BP: 127/73    Pulse: (!) 125 (!) 117  Resp: (!) 22 19  Temp:    SpO2: 100% 98%     General: Awake, ill-appearing, diaphoretic, restless HEENT: Mildly dilated pupils, normocephalic, atraumatic.  Full neck range of motion.  No midline neck pain.  No meningismus. CV:  Good peripheral perfusion.  Tachycardic, regular rhythm,, RP 2+ Resp:  Mild tachypnea.  CTAB.  Speaking full sentences. Abd:  No distention. Nontender to deep palpation throughout Back:  No tenderness to palpation in thoracic back low lower lumbar region with reported midline pain.  No CVAT.  Pain not worse with palpation.  No step-off. Other:  Normal strength bilateral lower extremities   ED Results / Procedures / Treatments   Labs (all labs ordered are listed, but only abnormal results are displayed) Labs Reviewed  LACTIC ACID, PLASMA - Abnormal; Notable for the following components:      Result Value   Lactic Acid, Venous 7.4 (*)    All other components within normal limits  COMPREHENSIVE METABOLIC PANEL WITH GFR -  Abnormal; Notable for the following components:   Sodium 146 (*)    CO2 19 (*)    Glucose, Bld 162 (*)    Calcium 10.9 (*)    Total Protein 8.7 (*)    Albumin 5.4 (*)    Anion gap 25 (*)    All other components within normal limits  CBC WITH DIFFERENTIAL/PLATELET - Abnormal; Notable for the following components:   WBC 19.7 (*)    Neutro Abs 17.2 (*)    Abs Immature Granulocytes 0.11 (*)    All other components within normal limits  LACTIC ACID, PLASMA - Abnormal; Notable for the following components:   Lactic Acid, Venous 3.1 (*)    All other components within normal limits  CULTURE, BLOOD (ROUTINE X 2)  CULTURE, BLOOD (ROUTINE X 2)  CK  URINALYSIS, W/ REFLEX TO CULTURE (INFECTION SUSPECTED)  HIV ANTIBODY (ROUTINE TESTING W REFLEX)  DRUG SCREEN 235116 11+OXYCO+ALC+CRT-BUND  BASIC METABOLIC PANEL WITH GFR  CBC     EKG  Ecg = narrow complex tachycardia, rate 135, some baseline artifact but I believe  he is in sinus rhythm as opposed to A-fib, no gross ST elevation or depression, no significant repolarization abnormality, normal axis, normal intervals.  No clear evidence of ischemia nor arrhythmia my interpretation.   RADIOLOGY Radiology interpreted myself and radiology report reviewed.  No acute pathology identified.    PROCEDURES:  Critical Care performed: Yes, see critical care procedure note(s)  .Critical Care  Performed by: Clarine Ozell LABOR, MD Authorized by: Clarine Ozell LABOR, MD   Critical care provider statement:    Critical care time (minutes):  45   Critical care time was exclusive of:  Separately billable procedures and treating other patients   Critical care was necessary to treat or prevent imminent or life-threatening deterioration of the following conditions:  Sepsis   Critical care was time spent personally by me on the following activities:  Development of treatment plan with patient or surrogate, discussions with consultants, evaluation of patient's response to treatment, examination of patient, ordering and review of laboratory studies, ordering and review of radiographic studies, ordering and performing treatments and interventions, pulse oximetry, re-evaluation of patient's condition and review of old charts   I assumed direction of critical care for this patient from another provider in my specialty: no     Care discussed with: admitting provider      MEDICATIONS ORDERED IN ED: Medications  cloNIDine (CATAPRES) tablet 0.1 mg (0.1 mg Oral Given 12/01/23 2311)  LORazepam  (ATIVAN ) injection 1 mg (has no administration in time range)  lactated ringers  infusion (0 mL/hr Intravenous Hold 12/01/23 2315)  acetaminophen  (TYLENOL ) tablet 650 mg (has no administration in time range)    Or  acetaminophen  (TYLENOL ) suppository 650 mg (has no administration in time range)  oxyCODONE  (Oxy IR/ROXICODONE ) immediate release tablet 5 mg (has no administration in time range)   ketorolac  (TORADOL ) 30 MG/ML injection 30 mg (has no administration in time range)  HYDROmorphone (DILAUDID) injection 1 mg (has no administration in time range)  piperacillin-tazobactam (ZOSYN) IVPB 3.375 g (3.375 g Intravenous New Bag/Given 12/01/23 2314)  sodium chloride  0.9 % bolus 2,000 mL (0 mLs Intravenous Stopped 12/01/23 1921)  ketorolac  (TORADOL ) 30 MG/ML injection 30 mg (30 mg Intramuscular Given 12/01/23 1619)  morphine (PF) 4 MG/ML injection 4 mg (4 mg Intravenous Given 12/01/23 1554)  piperacillin-tazobactam (ZOSYN) IVPB 3.375 g (0 g Intravenous Stopped 12/01/23 1804)  vancomycin  (VANCOREADY) IVPB 1500 mg/300 mL (0 mg Intravenous Stopped  12/01/23 2004)  iohexol  (OMNIPAQUE ) 300 MG/ML solution 100 mL (100 mLs Intravenous Contrast Given 12/01/23 1711)  HYDROmorphone (DILAUDID) injection 1 mg (1 mg Intravenous Given 12/01/23 1852)  sodium chloride  0.9 % bolus 1,000 mL (0 mLs Intravenous Stopped 12/01/23 2058)  acetaminophen  (TYLENOL ) tablet 1,000 mg (1,000 mg Oral Given 12/01/23 1854)  HYDROmorphone (DILAUDID) injection 1 mg (1 mg Intravenous Given 12/01/23 2058)     IMPRESSION / MDM / ASSESSMENT AND PLAN / ED COURSE  I reviewed the triage vital signs and the nursing notes.                              DDX/MDM/AP: Differential diagnosis includes, but is not limited to, broad differential including spinal epidural abscess, pyelonephritis, considered but doubt meningitis given very reassuring head and neck and screening neurologic exam here.  Consider intra-abdominal pathology despite benign abdominal exam.  Do also believe that patient has some element of opiate withdrawal, I have a COWS score 16-20.  Tells me has not responded well to buprenorphine  in the past, will give IV narcotic to treat pain and withdrawal.  No evidence of acute spinal cord pathology at this time.  Plan: - Labs --initial screening labs with leukocytosis 20 and a lactate of 7.4 - Empiric broad-spectrum  antibiotics - Chest x-ray - CT abdomen pelvis, L-spine, CT head - 2 L IV fluid, greater than 30 cc/kg - Toradol  for fever - N.p.o. - Morphine - Anticipate admission  Patient's presentation is most consistent with acute presentation with potential threat to life or bodily function.  The patient is on the cardiac monitor to evaluate for evidence of arrhythmia and/or significant heart rate changes.  ED course below.  Workup with notable leukocytosis and lactic acidosis which is fortunately downtrending on repeat.  Treating with broad-spectrum antibiotics.  This is an ongoing low back discomfort, no secondary signs or symptoms of spinal compression though can consider MRI.  Treating opiate withdrawal with opiates, does seem to be responding well to this, COWS score improving on my reeval.  No clear source of infection at this time, urinalysis pending. Presented hospitalist team for admission who agrees with escalation to MRI.  Clinical Course as of 12/01/23 2359  Tue Dec 01, 2023  1808 Imaging reviewed, no acute pathology identified [MM]  1846 Patient reevaluated, remains tachycardic and tremulous, I do suspect he has ongoing withdrawal, will treat with Dilaudid  Believes he uses about a gram of heroin daily  Spoke with lab, said they cannot run our last lactate sample, will resend.  If downtrending, plan for admission to floor.  Patient again denies any headache or neck pain or stiffness.  Do not clinically suspect meningitis at this time.  Unclear source of infection.  Urinalysis pending. [MM]  1959 Repeat lactate notably improved  CK normal  Overall unclear etiology of presentation today, treating for sepsis of unclear etiology.  Do not clinically suspect meningitis at this time.  Does have a history of IVDU, consider underlying endocarditis or other pathology.  [MM]    Clinical Course User Index [MM] Clarine Ozell LABOR, MD     FINAL CLINICAL IMPRESSION(S) / ED DIAGNOSES   Final  diagnoses:  Sepsis, due to unspecified organism, unspecified whether acute organ dysfunction present (HCC)  Opiate withdrawal (HCC)  Acute low back pain without sciatica, unspecified back pain laterality     Rx / DC Orders   ED Discharge Orders  None        Note:  This document was prepared using Dragon voice recognition software and may include unintentional dictation errors.   Clarine Ozell LABOR, MD 12/01/23 [phone]

## 2023-12-01 NOTE — ED Notes (Addendum)
 Critical Result: Lactic Acid 7.4  Siadecki, MD made aware

## 2023-12-01 NOTE — H&P (Signed)
 History and Physical    Patient: Jeffery Knight. FMW:[id number] DOB: Feb 29, 1990 DOA: 12/01/2023 DOS: the patient was seen and examined on 12/01/2023 PCP: Patient, No Pcp Per  Patient coming from: Home  Chief Complaint:  Chief Complaint  Patient presents with   Headache   Emesis    HPI: Jeffery Knight. is a 33 y.o. male with medical history significant for IVDU, MRSA abscess left forearm(10/2022) I&D'd by orthopedics (neg blood cultures at the time)  signing out AMA after 3 days, being admitted with sepsis of unknown source as well as opiate withdrawal.  He presented with acute low back pain, and was noted to be tachycardic, tachypneic and tremulous  on arrival in the setting of last opiate use the day prior.  Denies trauma to the area.  He denies lower extremity weakness or numbness, saddle anesthesia or bowel or bladder incontinence or retention.  Denies neck pain, neck stiffness, headache or visual disturbance.  Denies fever or chills.  Continues to use IV drugs, last use the day prior. Met sepsis criteria in the ED with temp 100.3, heart rate in the 120s to 130s, occasionally tachypneic to the mid 20s.  WBC 19,000 and lactic acid 3.1. Other labs notable for anion gap 25 with bicarb 19, glucose 162 and normal creatinine. UA pending Chest x-ray clear CT abdomen and pelvis with contrast nonacute CT LS spine with contrast unremarkable CT head nonacute  Patient treated with sepsis fluids, started on vancomycin  and Zosyn, given several doses of hydromorphone and morphine both for pain and opiate withdrawal symptoms of tachycardia/tremulousness  Admission requested     No past medical history on file. Past Surgical History:  Procedure Laterality Date   I & D EXTREMITY Left 11/15/2022   Procedure: IRRIGATION AND DEBRIDEMENT LEFT FOREARM;  Surgeon: Edie Norleen PARAS, MD;  Location: [facility] ORS;  Service: Orthopedics;  Laterality: Left;   left hand surgery      Social History:   reports that he has been smoking cigarettes. He has never used smokeless tobacco. He reports current drug use. Drug: Marijuana. He reports that he does not drink alcohol.  Allergies  Allergen Reactions   Suboxone  [Buprenorphine  Hcl-Naloxone  Hcl]    Tramadol  Itching    No family history on file.  Prior to Admission medications   Medication Sig Start Date End Date Taking? Authorizing Provider  HYDROcodone -acetaminophen  (NORCO/VICODIN) 5-325 MG tablet Take 1 tablet by mouth every 6 (six) hours as needed for moderate pain. Patient not taking: Reported on 11/15/2022 02/10/19   Saunders Shona LITTIE, PA-C  naproxen  (NAPROSYN ) 500 MG tablet Take 1 tablet (500 mg total) by mouth 2 (two) times daily with a meal. Patient not taking: Reported on 11/15/2022 02/10/19   Saunders Shona LITTIE, PA-C    Physical Exam: Vitals:   12/01/23 2109 12/01/23 2200 12/01/23 2201 12/01/23 2230  BP:   123/69 127/73  Pulse: (!) 136 82 90 (!) 125  Resp:  17 17 (!) 22  Temp:      TempSrc:      SpO2: 99% 100% 100% 100%  Weight:      Height:       Physical Exam  Labs on Admission: I have personally reviewed following labs and imaging studies  CBC: Recent Labs  Lab 12/01/23 1409  WBC 19.7*  NEUTROABS 17.2*  HGB 16.4  HCT 47.2  MCV 87.1  PLT 332   Basic Metabolic Panel: Recent Labs  Lab 12/01/23 1409  NA 146*  K  3.7  CL 102  CO2 19*  GLUCOSE 162*  BUN 12  CREATININE 0.99  CALCIUM 10.9*   GFR: Estimated Creatinine Clearance: 97.1 mL/min (by C-G formula based on SCr of 0.99 mg/dL). Liver Function Tests: Recent Labs  Lab 12/01/23 1409  AST 33  ALT 24  ALKPHOS 79  BILITOT 0.9  PROT 8.7*  ALBUMIN 5.4*   No results for input(s): LIPASE, AMYLASE in the last 168 hours. No results for input(s): AMMONIA in the last 168 hours. Coagulation Profile: No results for input(s): INR, PROTIME in the last 168 hours. Cardiac Enzymes: Recent Labs  Lab 12/01/23 1409  CKTOTAL 91   BNP (last 3  results) No results for input(s): PROBNP in the last 8760 hours. HbA1C: No results for input(s): HGBA1C in the last 72 hours. CBG: No results for input(s): GLUCAP in the last 168 hours. Lipid Profile: No results for input(s): CHOL, HDL, LDLCALC, TRIG, CHOLHDL, LDLDIRECT in the last 72 hours. Thyroid  Function Tests: No results for input(s): TSH, T4TOTAL, FREET4, T3FREE, THYROIDAB in the last 72 hours. Anemia Panel: No results for input(s): VITAMINB12, FOLATE, FERRITIN, TIBC, IRON, RETICCTPCT in the last 72 hours. Urine analysis:    Component Value Date/Time   COLORURINE YELLOW (A) 11/15/2022 0600   APPEARANCEUR CLEAR (A) 11/15/2022 0600   LABSPEC 1.020 11/15/2022 0600   PHURINE 6.0 11/15/2022 0600   GLUCOSEU NEGATIVE 11/15/2022 0600   HGBUR NEGATIVE 11/15/2022 0600   BILIRUBINUR NEGATIVE 11/15/2022 0600   KETONESUR 80 (A) 11/15/2022 0600   PROTEINUR NEGATIVE 11/15/2022 0600   NITRITE NEGATIVE 11/15/2022 0600   LEUKOCYTESUR NEGATIVE 11/15/2022 0600    Radiological Exams on Admission: CT L-SPINE NO CHARGE Result Date: 12/01/2023 CLINICAL DATA:  Severe low back pain, sepsis, history of IV drug use EXAM: CT Lumbar Spine with contrast TECHNIQUE: Technique: Multiplanar CT images of the lumbar spine were reconstructed from contemporary CT of the Abdomen and Pelvis. RADIATION DOSE REDUCTION: This exam was performed according to the departmental dose-optimization program which includes automated exposure control, adjustment of the mA and/or kV according to patient size and/or use of iterative reconstruction technique. CONTRAST:  No additional COMPARISON:  None Available. FINDINGS: Segmentation: 5 lumbar type vertebrae. Alignment: Normal. Vertebrae: No acute fracture or focal pathologic process. Paraspinal and other soft tissues: The paraspinal soft tissues are unremarkable. Visualized retroperitoneal structures appear normal. Please refer to concurrent CT  abdomen and pelvis exam for findings in those regions. Disc levels: The disc spaces are well preserved. No significant spondylosis or facet hypertrophy. Reconstructed images demonstrate no additional findings. IMPRESSION: 1. Unremarkable lumbar spine. Electronically Signed   By: Ozell Daring M.D.   On: 12/01/2023 17:58   CT HEAD WO CONTRAST ( ) Result Date: 12/01/2023 CLINICAL DATA:  Headache, vomiting, back pain, sepsis, history of IV drug use EXAM: CT HEAD WITHOUT CONTRAST TECHNIQUE: Contiguous axial images were obtained from the base of the skull through the vertex without intravenous contrast. RADIATION DOSE REDUCTION: This exam was performed according to the departmental dose-optimization program which includes automated exposure control, adjustment of the mA and/or kV according to patient size and/or use of iterative reconstruction technique. COMPARISON:  None Available. FINDINGS: Brain: No acute infarct or hemorrhage. Lateral ventricles and midline structures are unremarkable. No acute extra-axial fluid collections. No mass effect. Vascular: No hyperdense vessel or unexpected calcification. Skull: Normal. Negative for fracture or focal lesion. Sinuses/Orbits: No acute finding. Other: None. IMPRESSION: 1. No acute intracranial process. Electronically Signed   By: Ozell Daring HERO.D.  On: 12/01/2023 17:46   CT ABDOMEN PELVIS W CONTRAST Result Date: 12/01/2023 CLINICAL DATA:  Severe low back pain, vomiting, sepsis, history of IV drug abuse EXAM: CT ABDOMEN AND PELVIS WITH CONTRAST TECHNIQUE: Multidetector CT imaging of the abdomen and pelvis was performed using the standard protocol following bolus administration of intravenous contrast. RADIATION DOSE REDUCTION: This exam was performed according to the departmental dose-optimization program which includes automated exposure control, adjustment of the mA and/or kV according to patient size and/or use of iterative reconstruction technique. CONTRAST:   OMNIPAQUE  IOHEXOL  300 MG/ML  SOLN COMPARISON:  11/02/2018 FINDINGS: Lower chest: No acute pleural or parenchymal lung disease. Hepatobiliary: No focal liver abnormality is seen. No gallstones, gallbladder wall thickening, or biliary dilatation. Pancreas: Unremarkable. No pancreatic ductal dilatation or surrounding inflammatory changes. Spleen: Normal in size without focal abnormality. Adrenals/Urinary Tract: Adrenal glands are unremarkable. Kidneys are normal, without renal calculi, focal lesion, or hydronephrosis. Bladder is unremarkable. Stomach/Bowel: No bowel obstruction or ileus. Normal appendix right lower quadrant. No bowel wall thickening or inflammatory change. Vascular/Lymphatic: No significant vascular findings are present. No enlarged abdominal or pelvic lymph nodes. Reproductive: Prostate is unremarkable. Other: No free fluid or free intraperitoneal gas. No abdominal wall hernia. Musculoskeletal: No acute or destructive bony abnormalities. Reconstructed images demonstrate no additional findings. IMPRESSION: 1. No acute intra-abdominal or intrapelvic process. Electronically Signed   By: Ozell Daring M.D.   On: 12/01/2023 17:45   DG Chest Port 1 View Result Date: 12/01/2023 CLINICAL DATA:  Severe headache and vomiting this morning. Chronic back pain. Shaky. Clinical concern for sepsis. EXAM: PORTABLE CHEST 1 VIEW COMPARISON:  11/25/2022 FINDINGS: The heart size and mediastinal contours are within normal limits. Both lungs are clear. The visualized skeletal structures are unremarkable. IMPRESSION: No active disease. Electronically Signed   By: Elspeth Bathe M.D.   On: 12/01/2023 16:29   Data Reviewed for HPI: Relevant notes from primary care and specialist visits, past discharge summaries as available in EHR, including Care Everywhere. Prior diagnostic testing as pertinent to current admission diagnoses Updated medications and problem lists for reconciliation ED course, including vitals,  labs, imaging, treatment and response to treatment Triage notes, nursing and pharmacy notes and ED provider's notes Notable results as noted above in HPI      Assessment and Plan: * Sepsis (HCC) # Acute back pain, suspect discitis # History of MRSA abscess left forearm 10/2022 with I&D, incomplete antibiotics # History of IVDU Sepsis criteria fever, tachycardia and tachypnea, leukocytosis and lactic acidosis CT lumbar spine with contrast showed no acute findings Will order contrasted MRI to evaluate further for discitis--once able to get contrast IR consult based on MRI findings Pain control Continue sepsis fluids Continue Zosyn and vancomycin  Follow cultures Can consider ID consult   Acute opioid withdrawal (HCC) History of IV drug use  History of buprenorphine  intolerance Patient tachycardic to the 130s, tachypneic to the 20s, tremulous, taking all of his lines As tolerated clonidine,  As needed benzos, antiemetics Sitter if needed        DVT prophylaxis: SCD  Consults: none  Advance Care Planning:   Code Status: Prior   Family Communication: none  Disposition Plan: Back to previous home environment  Severity of Illness: The appropriate patient status for this patient is INPATIENT. Inpatient status is judged to be reasonable and necessary in order to provide the required intensity of service to ensure the patient's safety. The patient's presenting symptoms, physical exam findings, and initial radiographic and laboratory  data in the context of their chronic comorbidities is felt to place them at high risk for further clinical deterioration. Furthermore, it is not anticipated that the patient will be medically stable for discharge from the hospital within 2 midnights of admission.   * I certify that at the point of admission it is my clinical judgment that the patient will require inpatient hospital care spanning beyond 2 midnights from the point of admission due to  high intensity of service, high risk for further deterioration and high frequency of surveillance required.*  Author: Delayne LULLA Solian, MD 12/01/2023 10:40 PM  For on call review [email or web address].

## 2023-12-01 NOTE — ED Triage Notes (Signed)
 Pt had severe headache and started vomiting this morning. Pt only has chronic back pain at this moment. Pt is shaky in triage.

## 2023-12-02 ENCOUNTER — Other Ambulatory Visit: Payer: Self-pay

## 2023-12-02 ENCOUNTER — Inpatient Hospital Stay

## 2023-12-02 DIAGNOSIS — A419 Sepsis, unspecified organism: Principal | ICD-10-CM

## 2023-12-02 DIAGNOSIS — R6510 Systemic inflammatory response syndrome (SIRS) of non-infectious origin without acute organ dysfunction: Secondary | ICD-10-CM

## 2023-12-02 DIAGNOSIS — F1123 Opioid dependence with withdrawal: Secondary | ICD-10-CM | POA: Diagnosis not present

## 2023-12-02 DIAGNOSIS — M545 Low back pain, unspecified: Secondary | ICD-10-CM | POA: Diagnosis not present

## 2023-12-02 LAB — URINALYSIS, W/ REFLEX TO CULTURE (INFECTION SUSPECTED)
Bacteria, UA: NONE SEEN
Bilirubin Urine: NEGATIVE
Glucose, UA: NEGATIVE mg/dL
Hgb urine dipstick: NEGATIVE
Ketones, ur: 20 mg/dL — AB
Leukocytes,Ua: NEGATIVE
Nitrite: NEGATIVE
Protein, ur: NEGATIVE mg/dL
RBC / HPF: 0 RBC/hpf (ref 0–5)
Specific Gravity, Urine: >1.046 — ABNORMAL HIGH (ref 1.005–1.030)
Squamous Epithelial / HPF: 0 /HPF (ref 0–5)
pH: 5 (ref 5.0–8.0)

## 2023-12-02 LAB — CBC
HCT: 35.7 % — ABNORMAL LOW (ref 39.0–52.0)
Hemoglobin: 12.4 g/dL — ABNORMAL LOW (ref 13.0–17.0)
MCH: 30.3 pg (ref 26.0–34.0)
MCHC: 34.7 g/dL (ref 30.0–36.0)
MCV: 87.3 fL (ref 80.0–100.0)
Platelets: 231 K/uL (ref 150–400)
RBC: 4.09 MIL/uL — ABNORMAL LOW (ref 4.22–5.81)
RDW: 13.5 % (ref 11.5–15.5)
WBC: 17.1 K/uL — ABNORMAL HIGH (ref 4.0–10.5)
nRBC: 0 % (ref 0.0–0.2)

## 2023-12-02 LAB — BASIC METABOLIC PANEL WITH GFR
Anion gap: 11 (ref 5–15)
BUN: 13 mg/dL (ref 6–20)
CO2: 23 mmol/L (ref 22–32)
Calcium: 9.3 mg/dL (ref 8.9–10.3)
Chloride: 109 mmol/L (ref 98–111)
Creatinine, Ser: 0.74 mg/dL (ref 0.61–1.24)
GFR, Estimated: >60 mL/min (ref >60)
Glucose, Bld: 94 mg/dL (ref 70–99)
Potassium: 3.6 mmol/L (ref 3.5–5.1)
Sodium: 143 mmol/L (ref 135–145)

## 2023-12-02 LAB — LACTIC ACID, PLASMA: Lactic Acid, Venous: 1.2 mmol/L (ref 0.5–1.9)

## 2023-12-02 LAB — HIV ANTIBODY (ROUTINE TESTING W REFLEX): HIV Screen 4th Generation wRfx: NONREACTIVE

## 2023-12-02 MED ORDER — BUPRENORPHINE HCL 8 MG SL SUBL
8.0000 mg | SUBLINGUAL_TABLET | Freq: Once | SUBLINGUAL | Status: AC
  Administered 2023-12-02: 8 mg via SUBLINGUAL
  Filled 2023-12-02: qty 1

## 2023-12-02 MED ORDER — VANCOMYCIN HCL 1250 MG/250ML IV SOLN
1250.0000 mg | Freq: Two times a day (BID) | INTRAVENOUS | Status: DC
  Filled 2023-12-02: qty 250

## 2023-12-02 MED ORDER — VANCOMYCIN HCL IN DEXTROSE 1-5 GM/200ML-% IV SOLN
1000.0000 mg | Freq: Two times a day (BID) | INTRAVENOUS | Status: DC
  Administered 2023-12-02: 1000 mg via INTRAVENOUS
  Filled 2023-12-02: qty 200

## 2023-12-02 MED ORDER — LORAZEPAM 2 MG/ML IJ SOLN
2.0000 mg | INTRAMUSCULAR | Status: DC | PRN
Start: 2023-12-02 — End: 2023-12-02
  Administered 2023-12-02: 2 mg via INTRAVENOUS
  Filled 2023-12-02: qty 1

## 2023-12-02 MED ORDER — BUPRENORPHINE HCL 8 MG SL SUBL
8.0000 mg | SUBLINGUAL_TABLET | Freq: Every day | SUBLINGUAL | 0 refills | Status: AC
  Filled 2023-12-02: qty 5, 5d supply, fill #0

## 2023-12-02 MED ORDER — GADOBUTROL 1 MMOL/ML IV SOLN
6.0000 mL | Freq: Once | INTRAVENOUS | Status: AC | PRN
  Administered 2023-12-02: 6 mL via INTRAVENOUS

## 2023-12-02 NOTE — ED Notes (Signed)
 Pt taken for MRI.

## 2023-12-02 NOTE — Discharge Summary (Signed)
 Physician Discharge Summary   Patient: Jeffery Knight. MRN: [id number]  DOB: 1990/03/01   Admit:     Date of Admission: 12/01/2023 Admitted from: home   Discharge: Date of discharge: 12/02/23 Disposition: Home Condition at discharge: good  CODE STATUS: FULL CODE     Discharge Physician: Laneta Blunt, DO [facility]     PCP: Patient, No Pcp Per  Recommendations for Outpatient Follow-up:  Follow up with PCP asap         Discharge Diagnoses: Principal Problem:   Sepsis (HCC) Active Problems:   History of MRSA abscess left forearm 2024, s/p I&D, incomplete antibiotics   Acute low back pain   Acute opioid withdrawal (HCC)   Polysubstance abuse (HCC)   IVDU (intravenous drug user)      Hospital course / significant events:   Jeffery Knight. is a 33 y.o. male with medical history significant for IVDU, MRSA abscess left forearm a year ago (neg blood cultures at the time)  signing out AMA after 3 days without completing treatment, being admitted with sepsis of unknown source as well as opiate withdrawal.  He presented with acute low back pain, and was noted to be tachycardic, tachypneic and tremulous  on arrival in the setting of last opiate use the day prior.  Denies trauma to the area.  He denies lower extremity weakness or numbness, saddle anesthesia or bowel or bladder incontinence or retention.  Denies neck pain, neck stiffness, headache or visual disturbance.  Denies fever or chills.   States he stopped using IV drugs a year ago when he was hospitalized and he now snorts fentanyl  which he states is probably mixed with other stuff. Met sepsis criteria in the ED with temp 100.3, heart rate in the 120s to 130s, occasionally tachypneic to the mid 20s.  WBC 19,000 and lactic acid 3.1. Other labs notable for anion gap 25 with bicarb 19, glucose 162 and normal creatinine. UA pending Chest x-ray clear CT abdomen and pelvis with contrast nonacute CT  LS spine with contrast unremarkable CT head nonacute MRI Lspine no discitis BCx pending Pt reports feeling much better 12/02/23 morning and requesting for discharge. See below.     Consultants:  none  Procedures/Surgeries: None       ASSESSMENT & PLAN:   Sepsis unknown source vs more likely SIRS d/t drug use / withdrawal  Broad infectious w/u negative  WBC trending down but not normal VS improved but not normal  Advised stay here pending BCx but pt / mother decline. They would like to get him home to seek outpatient treatment and out of the hospital to help w/ comfort and sleep. Pt states he is motivated to quit street drugs and he has support from his mother who is present at bedside and is in agreement for discharge.  I have recommended given still meeting SIRS criteria, that he stay inpatient to ensure negative blood cultures and to initiate opiate use disorder treatment  In shared-decision-making w/ patient and his mother, agree for stopping abx, dc on buprenorphine  and what resources we can but he absolutely needs to establish with PCP and with treatment center to continue to monitor abstinence / discuss options for pharmacological relapse prevention. Risks were reviewed and pt/family accept risks including leaving hospital with incomplete information/treatment.  Rx for buprenorphine  - did not rx naloxone  component d/t hx nausea w/ Suboxone  though difficult to tell if this was drug effect vs withdrawal effect of naloxone   combination in acute setting    No concerns based on BMI: Body mass index is 20.09 kg/m.SABRA Significantly low or high BMI is associated with higher medical risk.  Underweight - under 18  overweight - 25 to 29 obese - 30 or more Class 1 obesity: BMI of 30.0 to 34 Class 2 obesity: BMI of 35.0 to 39 Class 3 obesity: BMI of 40.0 to 49 Super Morbid Obesity: BMI 50-59 Super-super Morbid Obesity: BMI 60+ Healthy nutrition and physical activity advised as adjunct  to other disease management and risk reduction treatments              Discharge Instructions  Allergies as of 12/02/2023       Reactions   Suboxone  [buprenorphine  Hcl-naloxone  Hcl]    Tramadol  Itching        Medication List     STOP taking these medications    HYDROcodone -acetaminophen  5-325 MG tablet Commonly known as: NORCO/VICODIN   naproxen  500 MG tablet Commonly known as: Naprosyn        TAKE these medications    buprenorphine  8 MG Subl SL tablet Commonly known as: SUBUTEX  Place 1 tablet (8 mg total) under the tongue daily. Start taking on: December 03, 2023          Allergies  Allergen Reactions   Suboxone  [Buprenorphine  Hcl-Naloxone  Hcl]    Tramadol  Itching     Subjective: pt pleasant this morning, he reports feeling better, would like to leave the hospital if possible. He reports poor sleep last night. His mom is present at bedside and offers support. He is tolerating diet, no nausea/vomiting, he is a bit anxious.    Discharge Exam: BP 119/81   Pulse (!) 114   Temp 98.7 F (37.1 C)   Resp (!) 22   Ht 5' 10 (1.778 m)   Wt 63.5 kg   SpO2 97%   BMI 20.09 kg/m  General: Pt is alert, awake, not in acute distress Cardiovascular:, S1/S2 reg rhythm, tachycardic, no rubs, no gallops Respiratory: CTA bilaterally, no wheezing, no rhonchi Abdominal: Soft, NT, ND, bowel sounds + Extremities: no edema, no cyanosis     The results of significant diagnostics from this hospitalization (including imaging, microbiology, ancillary and laboratory) are listed below for reference.     Microbiology: Recent Results (from the past 240 hours)  Blood Culture (routine x 2)     Status: None (Preliminary result)   Collection Time: 12/01/23  5:54 PM   Specimen: BLOOD  Result Value Ref Range Status   Specimen Description BLOOD RIGHT ANTECUBITAL  Final   Special Requests   Final    BOTTLES DRAWN AEROBIC AND ANAEROBIC Blood Culture results may not be  optimal due to an inadequate volume of blood received in culture bottles   Culture   Final    NO GROWTH < 24 HOURS Performed at [facility], [address], [place], [place] [address]    Report Status PENDING  Incomplete  Blood Culture (routine x 2)     Status: None (Preliminary result)   Collection Time: 12/01/23  5:54 PM   Specimen: BLOOD  Result Value Ref Range Status   Specimen Description BLOOD RIGHT ANTECUBITAL  Final   Special Requests   Final    BOTTLES DRAWN AEROBIC ONLY Blood Culture results may not be optimal due to an inadequate volume of blood received in culture bottles   Culture   Final    NO GROWTH < 24 HOURS Performed at [facility],  [address], [place], [place] [address]    Report Status PENDING  Incomplete     Labs: BNP (last 3 results) No results for input(s): BNP in the last 8760 hours. Basic Metabolic Panel: Recent Labs  Lab 12/01/23 1409 12/02/23 0159  NA 146* 143  K 3.7 3.6  CL 102 109  CO2 19* 23  GLUCOSE 162* 94  BUN 12 13  CREATININE 0.99 0.74  CALCIUM 10.9* 9.3   Liver Function Tests: Recent Labs  Lab 12/01/23 1409  AST 33  ALT 24  ALKPHOS 79  BILITOT 0.9  PROT 8.7*  ALBUMIN 5.4*   No results for input(s): LIPASE, AMYLASE in the last 168 hours. No results for input(s): AMMONIA in the last 168 hours. CBC: Recent Labs  Lab 12/01/23 1409 12/02/23 0159  WBC 19.7* 17.1*  NEUTROABS 17.2*  --   HGB 16.4 12.4*  HCT 47.2 35.7*  MCV 87.1 87.3  PLT 332 231   Cardiac Enzymes: Recent Labs  Lab 12/01/23 1409  CKTOTAL 91   BNP: Invalid input(s): POCBNP CBG: No results for input(s): GLUCAP in the last 168 hours. D-Dimer No results for input(s): DDIMER in the last 72 hours. Hgb A1c No results for input(s): HGBA1C in the last 72 hours. Lipid Profile No results for input(s): CHOL, HDL, LDLCALC, TRIG, CHOLHDL, LDLDIRECT in the last 72 hours. Thyroid  function studies No results  for input(s): TSH, T4TOTAL, T3FREE, THYROIDAB in the last 72 hours.  Invalid input(s): FREET3 Anemia work up No results for input(s): VITAMINB12, FOLATE, FERRITIN, TIBC, IRON, RETICCTPCT in the last 72 hours. Urinalysis    Component Value Date/Time   COLORURINE YELLOW (A) 12/02/2023 0756   APPEARANCEUR CLEAR (A) 12/02/2023 0756   LABSPEC >1.046 (H) 12/02/2023 0756   PHURINE 5.0 12/02/2023 0756   GLUCOSEU NEGATIVE 12/02/2023 0756   HGBUR NEGATIVE 12/02/2023 0756   BILIRUBINUR NEGATIVE 12/02/2023 0756   KETONESUR 20 (A) 12/02/2023 0756   PROTEINUR NEGATIVE 12/02/2023 0756   NITRITE NEGATIVE 12/02/2023 0756   LEUKOCYTESUR NEGATIVE 12/02/2023 0756   Sepsis Labs Recent Labs  Lab 12/01/23 1409 12/02/23 0159  WBC 19.7* 17.1*   Microbiology Recent Results (from the past 240 hours)  Blood Culture (routine x 2)     Status: None (Preliminary result)   Collection Time: 12/01/23  5:54 PM   Specimen: BLOOD  Result Value Ref Range Status   Specimen Description BLOOD RIGHT ANTECUBITAL  Final   Special Requests   Final    BOTTLES DRAWN AEROBIC AND ANAEROBIC Blood Culture results may not be optimal due to an inadequate volume of blood received in culture bottles   Culture   Final    NO GROWTH < 24 HOURS Performed at [facility], [address], [place], [place] [address]    Report Status PENDING  Incomplete  Blood Culture (routine x 2)     Status: None (Preliminary result)   Collection Time: 12/01/23  5:54 PM   Specimen: BLOOD  Result Value Ref Range Status   Specimen Description BLOOD RIGHT ANTECUBITAL  Final   Special Requests   Final    BOTTLES DRAWN AEROBIC ONLY Blood Culture results may not be optimal due to an inadequate volume of blood received in culture bottles   Culture   Final    NO GROWTH < 24 HOURS Performed at [facility], [address] [place], [place] [address]    Report Status PENDING  Incomplete   Imaging MR Lumbar  Spine W Wo  Contrast Result Date: 12/02/2023 CLINICAL DATA:  Initial evaluation for acute myelopathy.  Sepsis. EXAM: MRI LUMBAR SPINE WITHOUT AND WITH CONTRAST TECHNIQUE: Multiplanar and multiecho pulse sequences of the lumbar spine were obtained without and with intravenous contrast. CONTRAST:  6mL GADAVIST GADOBUTROL 1 MMOL/ML IV SOLN COMPARISON:  Prior CT from 12/01/2023. FINDINGS: Segmentation: Standard segmentation with the lowest well-formed disc space labeled the L5-S1 level. Rudimentary/hypoplastic ribs at T12 noted. Alignment: Vertebral bodies normally aligned with preservation of the normal lumbar lordosis. Trace facet mediated anterolisthesis of L5 on S1. Vertebrae: Vertebral body height maintained without acute or chronic fracture. Bone marrow signal intensity within normal limits. Small benign hemangioma noted within the L1 vertebral body. No worrisome osseous lesions. No evidence for osteomyelitis discitis or septic arthritis. Conus medullaris and cauda equina: Conus extends to the L1 level. Conus and cauda equina appear normal. Paraspinal and other soft tissues: Unremarkable. Disc levels: L1-2:  Unremarkable. L2-3:  Unremarkable. L3-4:  Unremarkable. L4-5: Minimal disc bulge with small biforaminal annular fissures. Mild bilateral facet spurring. No canal or foraminal stenosis. L5-S1: Negative interspace. Mild bilateral facet spurring. No stenosis. IMPRESSION: 1. No MRI evidence for acute infection within the lumbar spine. 2. Mild bilateral facet spurring at L4-5 and L5-S1. 3. Otherwise essentially normal MRI of the lumbar spine. No significant disc pathology, stenosis, or evidence for neural impingement. Electronically Signed   By: Morene Hoard M.D.   On: 12/02/2023 02:05   CT L-SPINE NO CHARGE Result Date: 12/01/2023 CLINICAL DATA:  Severe low back pain, sepsis, history of IV drug use EXAM: CT Lumbar Spine with contrast TECHNIQUE: Technique: Multiplanar CT images of the lumbar spine were  reconstructed from contemporary CT of the Abdomen and Pelvis. RADIATION DOSE REDUCTION: This exam was performed according to the departmental dose-optimization program which includes automated exposure control, adjustment of the mA and/or kV according to patient size and/or use of iterative reconstruction technique. CONTRAST:  No additional COMPARISON:  None Available. FINDINGS: Segmentation: 5 lumbar type vertebrae. Alignment: Normal. Vertebrae: No acute fracture or focal pathologic process. Paraspinal and other soft tissues: The paraspinal soft tissues are unremarkable. Visualized retroperitoneal structures appear normal. Please refer to concurrent CT abdomen and pelvis exam for findings in those regions. Disc levels: The disc spaces are well preserved. No significant spondylosis or facet hypertrophy. Reconstructed images demonstrate no additional findings. IMPRESSION: 1. Unremarkable lumbar spine. Electronically Signed   By: Ozell Daring M.D.   On: 12/01/2023 17:58   CT HEAD WO CONTRAST ( ) Result Date: 12/01/2023 CLINICAL DATA:  Headache, vomiting, back pain, sepsis, history of IV drug use EXAM: CT HEAD WITHOUT CONTRAST TECHNIQUE: Contiguous axial images were obtained from the base of the skull through the vertex without intravenous contrast. RADIATION DOSE REDUCTION: This exam was performed according to the departmental dose-optimization program which includes automated exposure control, adjustment of the mA and/or kV according to patient size and/or use of iterative reconstruction technique. COMPARISON:  None Available. FINDINGS: Brain: No acute infarct or hemorrhage. Lateral ventricles and midline structures are unremarkable. No acute extra-axial fluid collections. No mass effect. Vascular: No hyperdense vessel or unexpected calcification. Skull: Normal. Negative for fracture or focal lesion. Sinuses/Orbits: No acute finding. Other: None. IMPRESSION: 1. No acute intracranial process. Electronically  Signed   By: Ozell Daring M.D.   On: 12/01/2023 17:46   CT ABDOMEN PELVIS W CONTRAST Result Date: 12/01/2023 CLINICAL DATA:  Severe low back pain, vomiting, sepsis, history of IV drug abuse EXAM: CT ABDOMEN AND PELVIS WITH CONTRAST  TECHNIQUE: Multidetector CT imaging of the abdomen and pelvis was performed using the standard protocol following bolus administration of intravenous contrast. RADIATION DOSE REDUCTION: This exam was performed according to the departmental dose-optimization program which includes automated exposure control, adjustment of the mA and/or kV according to patient size and/or use of iterative reconstruction technique. CONTRAST:  OMNIPAQUE  IOHEXOL  300 MG/ML  SOLN COMPARISON:  11/02/2018 FINDINGS: Lower chest: No acute pleural or parenchymal lung disease. Hepatobiliary: No focal liver abnormality is seen. No gallstones, gallbladder wall thickening, or biliary dilatation. Pancreas: Unremarkable. No pancreatic ductal dilatation or surrounding inflammatory changes. Spleen: Normal in size without focal abnormality. Adrenals/Urinary Tract: Adrenal glands are unremarkable. Kidneys are normal, without renal calculi, focal lesion, or hydronephrosis. Bladder is unremarkable. Stomach/Bowel: No bowel obstruction or ileus. Normal appendix right lower quadrant. No bowel wall thickening or inflammatory change. Vascular/Lymphatic: No significant vascular findings are present. No enlarged abdominal or pelvic lymph nodes. Reproductive: Prostate is unremarkable. Other: No free fluid or free intraperitoneal gas. No abdominal wall hernia. Musculoskeletal: No acute or destructive bony abnormalities. Reconstructed images demonstrate no additional findings. IMPRESSION: 1. No acute intra-abdominal or intrapelvic process. Electronically Signed   By: Ozell Daring M.D.   On: 12/01/2023 17:45   DG Chest Port 1 View Result Date: 12/01/2023 CLINICAL DATA:  Severe headache and vomiting this morning. Chronic  back pain. Shaky. Clinical concern for sepsis. EXAM: PORTABLE CHEST 1 VIEW COMPARISON:  11/25/2022 FINDINGS: The heart size and mediastinal contours are within normal limits. Both lungs are clear. The visualized skeletal structures are unremarkable. IMPRESSION: No active disease. Electronically Signed   By: Elspeth Bathe M.D.   On: 12/01/2023 16:29      Time coordinating discharge: over 30 minutes  SIGNED:  Mashelle Busick DO [facility]

## 2023-12-02 NOTE — ED Notes (Signed)
 Pt ambulated independently to restroom. Urine sample provided.

## 2023-12-02 NOTE — Progress Notes (Signed)
-----------------------------------------------------------  [facility] --------------------------------------------------------D(Data) A(Action) R(response) Note------------------------------------------------  Patient Name: Jeffery Knight. Patient DOB: 13-Jul-1990 Date: @TODAY @      Data: Pt with tachycardia, needs COWS    Action: Non taken at this time.      Response:       Sharolyn Batman, RN [facility]

## 2023-12-02 NOTE — Hospital Course (Signed)
 Hospital course / significant events:   HPI: ***  ***     Consultants:  ***  Procedures/Surgeries: ***      ASSESSMENT & PLAN:   Sepsis unknown source vs more likely SIRS d/t drug use / withdrawal  Broad infectious w/u negative  Advised stay here pending BCx but pt / mother decline. They would like to get him home to seek outpatient treatment and out of the hospital to help w/ comfort and sleep. Pt states he is motivated to quit street drugs and he has support from his mother who is present at bedside and is in agreement for discharge.  I have recommended stay inpatient to ensure negative blood cultures and to initiate opiate use disorder treatment  In shared-decision-making w/ patient and his mother, agree for stopping abx, dc on buprenorphine  and what resources we can but he absolutely needs to establish with PCP and with treatment center to continue to monitor abstinence / discuss options for pharmacological relapse prevention. Risks were reviewed and pt/family accept risks including leaving hospital with incomplete information/treatment.  Rx for buprenorphine  - did not rx naloxone  component d/t hx nausea w/ Suboxone  though difficult to tell if this was drug effect vs withdrawal effect of naloxone  combination in acute setting    No concerns based on BMI: Body mass index is 20.09 kg/m.SABRA Significantly low or high BMI is associated with higher medical risk.  Underweight - under 18  overweight - 25 to 29 obese - 30 or more Class 1 obesity: BMI of 30.0 to 34 Class 2 obesity: BMI of 35.0 to 39 Class 3 obesity: BMI of 40.0 to 49 Super Morbid Obesity: BMI 50-59 Super-super Morbid Obesity: BMI 60+ Healthy nutrition and physical activity advised as adjunct to other disease management and risk reduction treatments    DVT prophylaxis: *** IV fluids: *** continuous IV fluids  Nutrition: *** Central lines / other devices: ***  Code Status: *** ACP documentation reviewed: ***  none on file in [facility]  TOC needs: *** Medical barriers to dispo: ***. Expected medical readiness for discharge ***.

## 2023-12-02 NOTE — Progress Notes (Signed)
 Pharmacy Antibiotic Note  Jeffery L Fillinger Jr. is a 33 y.o. male admitted on 12/01/2023 with cellulitis/sepsis.  Pharmacy has been consulted for Vancomycin  and Zosyn dosing.  Plan: Change vancomycin  to 1250 mg IV every 12 hours Estimated AUC 525, Cmin 12.8 Wt 63.5 kg, Scr rounded to 0.8, Vd 0.72 Vancomycin  levels at steady state or as clinically indicated Continue Zosyn 3.375 grams IV every 8 hours (4 hour infusion) Follow renal function and cultures for adjustments   Temp (24hrs), Avg:98.9 F (37.2 C), Min:97.8 F (36.6 C), Max:100.3 F (37.9 C)   Recent Labs  Lab 12/01/23 1409 12/01/23 1907 12/02/23 0159  WBC 19.7*  --  17.1*  CREATININE 0.99  --  0.74  LATICACIDVEN 7.4* 3.1* 1.2    Estimated Creatinine Clearance: 120.2 mL/min (by C-G formula based on SCr of 0.74 mg/dL).    Allergies  Allergen Reactions   Suboxone  [Buprenorphine  Hcl-Naloxone  Hcl]    Tramadol  Itching    Antimicrobials this admission: 11/11 Zosyn >>  11/11 Vancomycin  >>   Microbiology results: 11/11 BCx: ngtd  Thank you for allowing pharmacy to be a part of this patient's care.  Kayla JULIANNA Blew, PharmD, BCPS 12/02/2023 10:36 AM

## 2023-12-02 NOTE — Progress Notes (Signed)
 Pharmacy Antibiotic Note  Jeffery L Linders Jr. is a 33 y.o. male admitted on 12/01/2023 with cellulitis.  Pharmacy has been consulted for Vancomycin  and Zosyn dosing.  Plan: Zosyn 3.375g IV q8h (4 hour infusion).  Pt given Vancomycin  1500 mg once. Vancomycin  1000 mg IV Q 12 hrs. Goal AUC 400-550. Expected AUC: 514.7 SCr used: 0.99, TBW 63.5 kg < IBW 73 kg  Follow up culture results to assess for antibiotic optimization. Monitor renal function to assess for any necessary antibiotic dosing changes. Pharmacy will continue to follow and will adjust abx dosing whenever warranted.  Temp (24hrs), Avg:98.9 F (37.2 C), Min:97.8 F (36.6 C), Max:100.3 F (37.9 C)   Recent Labs  Lab 12/01/23 1409 12/01/23 1907  WBC 19.7*  --   CREATININE 0.99  --   LATICACIDVEN 7.4* 3.1*    Estimated Creatinine Clearance: 97.1 mL/min (by C-G formula based on SCr of 0.99 mg/dL).    Allergies  Allergen Reactions   Suboxone  [Buprenorphine  Hcl-Naloxone  Hcl]    Tramadol  Itching    Antimicrobials this admission: 11/11 Zosyn >>  11/11 Vancomycin  >>   Microbiology results: 11/11 BCx: Pending  Thank you for allowing pharmacy to be a part of this patient's care.  Rankin CANDIE Dills, PharmD, MBA 12/02/2023 1:23 AM

## 2023-12-02 NOTE — ED Notes (Signed)
 Pt verbalizes understanding of discharge instructions. Opportunity for questioning and answers were provided. Pt discharged from ED with mother.

## 2023-12-06 LAB — CULTURE, BLOOD (ROUTINE X 2)
Culture: NO GROWTH
Culture: NO GROWTH

## 2023-12-11 LAB — DRUG SCREEN 764883 11+OXYCO+ALC+CRT-BUND
Amphetamines, Urine: NEGATIVE ng/mL
BENZODIAZ UR QL: NEGATIVE ng/mL
Barbiturate screen, urine: NEGATIVE ng/mL
Cocaine (Metab.): NEGATIVE ng/mL
Creatinine, Urine: 182.5 mg/dL (ref 20.0–300.0)
Ethanol U, Quan: NEGATIVE %
Meperidine: NEGATIVE ng/mL
Methadone Screen, Urine: NEGATIVE ng/mL
Nitrite Urine, Quantitative: NEGATIVE ug/mL
Oxycodone/Oxymorphone, Urine: NEGATIVE ng/mL
Phencyclidine, Ur: NEGATIVE ng/mL
Propoxyphene, Urine: NEGATIVE ng/mL
Tramadol: NEGATIVE ng/mL
pH, Urine: 5.3 (ref 4.5–8.9)

## 2023-12-11 LAB — OPIATES CONFIRMATION, URINE
Hydrocodone: NEGATIVE
Hydromorphone Confirm: >3000 ng/mL
Hydromorphone: POSITIVE — AB
Morphine Confirm: >3000 ng/mL
Morphine: POSITIVE — AB

## 2023-12-11 LAB — CANNABINOID CONFIRMATION, UR
CANNABINOIDS: POSITIVE — AB
Carboxy THC GC/MS Conf: >750 ng/mL
